# Patient Record
Sex: Female | Born: 1995 | Race: White | Hispanic: No | Smoking: Never smoker
Health system: Southern US, Community
[De-identification: ages and names within clinical notes are randomized; demographics above are authoritative.]

## PROBLEM LIST (undated history)

## (undated) DIAGNOSIS — E282 Polycystic ovarian syndrome: Secondary | ICD-10-CM

## (undated) DIAGNOSIS — E785 Hyperlipidemia, unspecified: Secondary | ICD-10-CM

## (undated) DIAGNOSIS — F32A Depression, unspecified: Secondary | ICD-10-CM

## (undated) DIAGNOSIS — E119 Type 2 diabetes mellitus without complications: Secondary | ICD-10-CM

## (undated) DIAGNOSIS — K589 Irritable bowel syndrome without diarrhea: Secondary | ICD-10-CM

## (undated) DIAGNOSIS — F419 Anxiety disorder, unspecified: Secondary | ICD-10-CM

## (undated) DIAGNOSIS — T7840XA Allergy, unspecified, initial encounter: Secondary | ICD-10-CM

## (undated) DIAGNOSIS — K219 Gastro-esophageal reflux disease without esophagitis: Secondary | ICD-10-CM

## (undated) DIAGNOSIS — R2991 Unspecified symptoms and signs involving the musculoskeletal system: Secondary | ICD-10-CM

## (undated) HISTORY — DX: Gastro-esophageal reflux disease without esophagitis: K21.9

## (undated) HISTORY — PX: TONSILLECTOMY: SUR1361

## (undated) HISTORY — DX: Allergy, unspecified, initial encounter: T78.40XA

## (undated) HISTORY — PX: WRIST SURGERY: SHX841

---

## 2018-04-23 ENCOUNTER — Emergency Department: Payer: 59

## 2018-04-23 ENCOUNTER — Emergency Department
Admission: EM | Admit: 2018-04-23 | Discharge: 2018-04-23 | Disposition: A | Discharge status: Home / Self Care | Payer: 59 | Attending: Emergency Medicine | Admitting: Emergency Medicine

## 2018-04-23 ENCOUNTER — Other Ambulatory Visit: Payer: Self-pay

## 2018-04-23 ENCOUNTER — Encounter: Payer: Self-pay | Admitting: *Deleted

## 2018-04-23 DIAGNOSIS — M549 Dorsalgia, unspecified: Secondary | ICD-10-CM | POA: Diagnosis present

## 2018-04-23 DIAGNOSIS — N201 Calculus of ureter: Secondary | ICD-10-CM

## 2018-04-23 DIAGNOSIS — N23 Unspecified renal colic: Secondary | ICD-10-CM | POA: Diagnosis not present

## 2018-04-23 HISTORY — DX: Unspecified symptoms and signs involving the musculoskeletal system: R29.91

## 2018-04-23 HISTORY — DX: Polycystic ovarian syndrome: E28.2

## 2018-04-23 LAB — URINALYSIS, COMPLETE (UACMP) WITH MICROSCOPIC
Bacteria, UA: NONE SEEN
Bilirubin Urine: NEGATIVE
GLUCOSE, UA: NEGATIVE mg/dL
Ketones, ur: NEGATIVE mg/dL
Nitrite: NEGATIVE
Protein, ur: NEGATIVE mg/dL
Specific Gravity, Urine: 1.019 (ref 1.005–1.030)
pH: 5 (ref 5.0–8.0)

## 2018-04-23 LAB — BASIC METABOLIC PANEL
Anion gap: 9 (ref 5–15)
BUN: 14 mg/dL (ref 6–20)
CALCIUM: 9.6 mg/dL (ref 8.9–10.3)
CO2: 25 mmol/L (ref 22–32)
Chloride: 103 mmol/L (ref 98–111)
Creatinine, Ser: 0.61 mg/dL (ref 0.44–1.00)
GFR calc Af Amer: >60 mL/min (ref >60)
GFR calc non Af Amer: >60 mL/min (ref >60)
Glucose, Bld: 116 mg/dL — ABNORMAL HIGH (ref 70–99)
Potassium: 4 mmol/L (ref 3.5–5.1)
Sodium: 137 mmol/L (ref 135–145)

## 2018-04-23 LAB — CBC
HCT: 42.2 % (ref 36.0–46.0)
Hemoglobin: 14.3 g/dL (ref 12.0–15.0)
MCH: 28.7 pg (ref 26.0–34.0)
MCHC: 33.9 g/dL (ref 30.0–36.0)
MCV: 84.6 fL (ref 80.0–100.0)
Platelets: 373 10*3/uL (ref 150–400)
RBC: 4.99 MIL/uL (ref 3.87–5.11)
RDW: 12 % (ref 11.5–15.5)
WBC: 12.8 10*3/uL — ABNORMAL HIGH (ref 4.0–10.5)
nRBC: 0 % (ref 0.0–0.2)

## 2018-04-23 LAB — POCT PREGNANCY, URINE: Preg Test, Ur: NEGATIVE

## 2018-04-23 MED ORDER — OXYCODONE-ACETAMINOPHEN 5-325 MG PO TABS: 1.0000 | ORAL_TABLET | Freq: Four times a day (QID) | ORAL | 0 refills | Status: DC | PRN

## 2018-04-23 MED ORDER — KETOROLAC TROMETHAMINE 30 MG/ML IJ SOLN
15.0000 mg | Freq: Once | INTRAMUSCULAR | Status: AC
  Administered 2018-04-23: 15 mg via INTRAVENOUS
  Filled 2018-04-23: qty 1

## 2018-04-23 MED ORDER — CEPHALEXIN 500 MG PO CAPS
500.0000 mg | ORAL_CAPSULE | Freq: Once | ORAL | Status: AC
  Administered 2018-04-23: 500 mg via ORAL
  Filled 2018-04-23: qty 1

## 2018-04-23 MED ORDER — ONDANSETRON 4 MG PO TBDP
4.0000 mg | ORAL_TABLET | Freq: Once | ORAL | Status: AC
  Administered 2018-04-23: 4 mg via ORAL
  Filled 2018-04-23: qty 1

## 2018-04-23 MED ORDER — CEPHALEXIN 500 MG PO CAPS: 500.0000 mg | ORAL_CAPSULE | Freq: Two times a day (BID) | ORAL | 0 refills | Status: DC

## 2018-04-23 MED ORDER — MORPHINE SULFATE (PF) 4 MG/ML IV SOLN
8.0000 mg | Freq: Once | INTRAVENOUS | Status: AC
  Administered 2018-04-23: 8 mg via INTRAVENOUS
  Filled 2018-04-23: qty 2

## 2018-04-23 MED ORDER — ONDANSETRON 4 MG PO TBDP: ORAL_TABLET | ORAL | 0 refills | Status: DC

## 2018-04-23 MED ORDER — ONDANSETRON HCL 4 MG/2ML IJ SOLN
4.0000 mg | Freq: Once | INTRAMUSCULAR | Status: AC
  Administered 2018-04-23: 4 mg via INTRAVENOUS
  Filled 2018-04-23: qty 2

## 2018-04-23 MED ORDER — DOCUSATE SODIUM 100 MG PO CAPS: ORAL_CAPSULE | ORAL | 0 refills | Status: DC

## 2018-04-23 MED ORDER — OXYCODONE-ACETAMINOPHEN 5-325 MG PO TABS
2.0000 | ORAL_TABLET | Freq: Once | ORAL | Status: AC
  Administered 2018-04-23: 2 via ORAL
  Filled 2018-04-23: qty 2

## 2018-04-23 NOTE — Discharge Instructions (Signed)
You have been seen in the Emergency Department (ED) today for pain that we believe based on your workup, is caused by kidney stones.  As we have discussed, please drink plenty of fluids.  Please make a follow up appointment with the physician(s) listed elsewhere in this documentation. ° °You may take pain medication as needed but ONLY as prescribed.  Please also take your prescribed Flomax daily.  We also recommend that you take over-the-counter ibuprofen regularly according to label instructions over the next 5 days.  Take it with meals to minimize stomach discomfort. ° °Please see your doctor as soon as possible as stones may take 1-3 weeks to pass and you may require additional care or medications. ° °Do not drink alcohol, drive or participate in any other potentially dangerous activities while taking opiate pain medication as it may make you sleepy. Do not take this medication with any other sedating medications, either prescription or over-the-counter. If you were prescribed Percocet or Vicodin, do not take these with acetaminophen (Tylenol) as it is already contained within these medications. °  °Take Percocet as needed for severe pain.  This medication is an opiate (or narcotic) pain medication and can be habit forming.  Use it as little as possible to achieve adequate pain control.  Do not use or use it with extreme caution if you have a history of opiate abuse or dependence.  If you are on a pain contract with your primary care doctor or a pain specialist, be sure to let them know you were prescribed this medication today from the [facility].  This medication is intended for your use only - do not give any to anyone else and keep it in a secure place where nobody else, especially children, have access to it.  It will also cause or worsen constipation, so you may want to consider taking an over-the-counter stool softener while you are taking this medication. ° °Return to the  Emergency Department (ED) or call your doctor if you have any worsening pain, fever, painful urination, are unable to urinate, or develop other symptoms that concern you. ° °

## 2018-04-23 NOTE — ED Notes (Signed)
Pt unable to void at this time. 

## 2018-04-23 NOTE — ED Provider Notes (Signed)
[facility] Emergency Department Provider Note  ____________________________________________   First MD Initiated Contact with Patient 04/23/18 0407     (approximate)  I have reviewed the triage vital signs and the nursing notes.   HISTORY  Chief Complaint Back Pain    HPI Rhonda Cohen is a 22 y.o. female with noncontributory past medical history who presents for evaluation of acute onset and severe sharp stabbing pain in her left flank that radiates around slightly to the front.  Is been accompanied with nausea and vomiting.  She has no personal history of kidney stones but to first-degree relatives that have had multiple episodes of stones.  She has had no dysuria and no gross hematuria.  Nothing in particular makes the symptoms better or worse and she cannot find a position of comfort.  She reports that about a week ago she was on her menstrual period and had a little bit of pain in the same area but it went away and she attributed it to menstrual pain.  She awoke from sleep several hours ago with the severe pain tonight.  She took 2 extra strength Tylenol and it did not help.   Past Medical History:  Diagnosis Date  . Musculoskeletal abnormal finding on examination    congenital bilateral wrist deformities  . PCOS (polycystic ovarian syndrome)     There are no active problems to display for this patient.   History reviewed. No pertinent surgical history.  Prior to Admission medications   Medication Sig Start Date End Date Taking? Authorizing Provider  cephALEXin (KEFLEX) 500 MG capsule Take 1 capsule (500 mg total) by mouth 2 (two) times daily. 04/23/18   Loleta RoseForbach, Michille Mcelrath, MD  docusate sodium (COLACE) 100 MG capsule Take 1 tablet once or twice daily as needed for constipation while taking narcotic pain medicine 04/23/18   Loleta RoseForbach, Ermie Glendenning, MD  ondansetron (ZOFRAN ODT) 4 MG disintegrating tablet Allow 1-2 tablets to dissolve in your mouth every 8 hours as  needed for nausea/vomiting 04/23/18   Loleta RoseForbach, Alvon Nygaard, MD  oxyCODONE-acetaminophen (PERCOCET) 5-325 MG tablet Take 1-2 tablets by mouth every 6 (six) hours as needed for severe pain. 04/23/18   Loleta RoseForbach, Halia Franey, MD    Allergies Sulfa antibiotics  History reviewed. No pertinent family history.  Social History Social History   Tobacco Use  . Smoking status: Never Smoker  . Smokeless tobacco: Never Used  Substance Use Topics  . Alcohol use: Not Currently  . Drug use: Not on file    Review of Systems Constitutional: No fever/chills Eyes: No visual changes. ENT: No sore throat. Cardiovascular: Denies chest pain. Respiratory: Denies shortness of breath. Gastrointestinal: Left flank pain with nausea and vomiting as described above. Genitourinary: No gross hematuria nor dysuria. Musculoskeletal: Negative for neck pain.  Left flank pain as described above. Integumentary: Negative for rash. Neurological: Negative for headaches, focal weakness or numbness.   ____________________________________________   PHYSICAL EXAM:  VITAL SIGNS: ED Triage Vitals  Enc Vitals Group     BP 04/23/18 0231 (!) 141/100     Pulse Rate 04/23/18 0231 80     Resp 04/23/18 0231 18     Temp 04/23/18 0231 98.3 F (36.8 C)     Temp Source 04/23/18 0231 Oral     SpO2 04/23/18 0231 99 %     Weight 04/23/18 0229 89.8 kg (198 lb)     Height 04/23/18 0229 1.524 m (5')     Head Circumference --  Peak Flow --      Pain Score 04/23/18 0228 8     Pain Loc --      Pain Edu? --      Excl. in GC? --     Constitutional: Alert and oriented.  Appears acutely uncomfortable. Eyes: Conjunctivae are normal.  Head: Atraumatic. Nose: No congestion/rhinnorhea. Mouth/Throat: Mucous membranes are moist. Neck: No stridor.  No meningeal signs.   Cardiovascular: Normal rate, regular rhythm. Good peripheral circulation. Grossly normal heart sounds. Respiratory: Normal respiratory effort.  No retractions. Lungs  CTAB. Gastrointestinal: Soft and nontender. No distention.  Musculoskeletal: Left CVA tenderness to percussion.  No lower extremity tenderness nor edema. No gross deformities of extremities. Neurologic:  Normal speech and language. No gross focal neurologic deficits are appreciated.  Skin:  Skin is warm, dry and intact. No rash noted. Psychiatric: Mood and affect are normal. Speech and behavior are normal.  ____________________________________________   LABS (all labs ordered are listed, but only abnormal results are displayed)  Labs Reviewed  BASIC METABOLIC PANEL - Abnormal; Notable for the following components:      Result Value   Glucose, Bld 116 (*)    All other components within normal limits  CBC - Abnormal; Notable for the following components:   WBC 12.8 (*)    All other components within normal limits  URINALYSIS, COMPLETE (UACMP) WITH MICROSCOPIC - Abnormal; Notable for the following components:   Color, Urine YELLOW (*)    APPearance CLOUDY (*)    Hgb urine dipstick MODERATE (*)    Leukocytes, UA TRACE (*)    All other components within normal limits  URINE CULTURE  POC URINE PREG, ED  POCT PREGNANCY, URINE   ____________________________________________  EKG  None - EKG not ordered by ED physician ____________________________________________  RADIOLOGY   ED MD interpretation: 2 mm left sided UVJ stone with hydroureter and perinephric stranding.  There is also a 4 mm calculus in the right kidney.  Official radiology report(s): Ct Renal Stone Study  Result Date: 04/23/2018 CLINICAL DATA:  Low back pain radiating to left flank EXAM: CT ABDOMEN AND PELVIS WITHOUT CONTRAST TECHNIQUE: Multidetector CT imaging of the abdomen and pelvis was performed following the standard protocol without IV contrast. COMPARISON:  None. FINDINGS: LOWER CHEST: There is no basilar pleural or apical pericardial effusion. HEPATOBILIARY: The hepatic contours and density are normal. There  is no intra- or extrahepatic biliary dilatation. The gallbladder is normal. PANCREAS: The pancreatic parenchymal contours are normal and there is no ductal dilatation. There is no peripancreatic fluid collection. SPLEEN: Normal. ADRENALS/URINARY TRACT: --Adrenal glands: Normal. --Right kidney/ureter: Single nonobstructing renal calculus measuring 4 mm. No hydronephrosis, perinephric stranding or solid renal mass. --Left kidney/ureter: There is a stone within the distal ureter, at the ureterovesical junction measuring 2 mm, causing mild hydroureter and minimal perinephric stranding. --Urinary bladder: Normal for degree of distention STOMACH/BOWEL: --Stomach/Duodenum: There is no hiatal hernia or other gastric abnormality. The duodenal course and caliber are normal. --Small bowel: No dilatation or inflammation. --Colon: No focal abnormality. --Appendix: Normal. VASCULAR/LYMPHATIC: Normal course and caliber of the major abdominal vessels. No abdominal or pelvic lymphadenopathy. REPRODUCTIVE: Normal uterus and ovaries. MUSCULOSKELETAL. No bony spinal canal stenosis or focal osseous abnormality. OTHER: None. IMPRESSION: 1. Left ureterovesical junction stone measuring 2 mm, causing mild hydroureter and perinephric stranding. 2. Single nonobstructing right renal calculus measuring 4 mm. Electronically Signed   By: Deatra RobinsonKevin  Herman M.D.   On: 04/23/2018 03:48    ____________________________________________  PROCEDURES  Critical Care performed: No   Procedure(s) performed:   Procedures   ____________________________________________   INITIAL IMPRESSION / ASSESSMENT AND PLAN / ED COURSE  As part of my medical decision making, I reviewed the following data within the electronic MEDICAL RECORD NUMBER Notes from prior ED visits and [place] Controlled Substance Database    Differential diagnosis includes, but is not limited to, renal colic, UTI/pyelonephritis, less likely renal infarction, ovarian torsion, etc.  The  patient's lab work is notable for what appears to be an contaminated urinalysis with no clear evidence of infection but of which a mild infection is possible.  Urine pregnancy test is negative.  She has a mild leukocytosis of 12.8 and an essentially normal basic metabolic panel.  I have ordered a urine culture.  CT scan demonstrates a 2 mm stone that has nearly passed into the bladder.  I had my usual customary kidney stone discussion with the patient.  We will place an IV and provide morphine 8 mg IV and she is already received Zofran 4 mg by mouth.  Once her pain is adequately controlled she will be transitioned to oral medication and will follow-up as an outpatient with urology.  I am treating her with Keflex on the chance that she does have a urinary tract infection and the morbidity/mortality associated with an infected stone.  She understands and agrees with all of this plan.  Clinical Course as of Apr 23 649  Tue Apr 23, 2018  1610 Patient required a dose of Toradol 15 mg IV and 2 Percocet by mouth but her pain was adequately controlled to allow her to go home.  She has information about follow-up and my usual customary return precautions.   [CF]    Clinical Course User Index [CF] Loleta Rose, MD    ____________________________________________  FINAL CLINICAL IMPRESSION(S) / ED DIAGNOSES  Final diagnoses:  Ureteral stone  Renal colic on left side     MEDICATIONS GIVEN DURING THIS VISIT:  Medications  ondansetron (ZOFRAN-ODT) disintegrating tablet 4 mg (4 mg Oral Given 04/23/18 0404)  morphine 4 MG/ML injection 8 mg (8 mg Intravenous Given 04/23/18 0433)  cephALEXin (KEFLEX) capsule 500 mg (500 mg Oral Given 04/23/18 0433)  ondansetron (ZOFRAN) injection 4 mg (4 mg Intravenous Given 04/23/18 0444)  ketorolac (TORADOL) 30 MG/ML injection 15 mg (15 mg Intravenous Given 04/23/18 0527)  oxyCODONE-acetaminophen (PERCOCET/ROXICET) 5-325 MG per tablet 2 tablet (2 tablets Oral Given  04/23/18 0526)     ED Discharge Orders         Ordered    cephALEXin (KEFLEX) 500 MG capsule  2 times daily     04/23/18 0425    oxyCODONE-acetaminophen (PERCOCET) 5-325 MG tablet  Every 6 hours PRN     04/23/18 0425    ondansetron (ZOFRAN ODT) 4 MG disintegrating tablet     04/23/18 0425    docusate sodium (COLACE) 100 MG capsule     04/23/18 0425           Note:  This document was prepared using Dragon voice recognition software and may include unintentional dictation errors.    Loleta Rose, MD 04/23/18 [phone]

## 2018-04-23 NOTE — ED Triage Notes (Signed)
Pt has left lower back pain.  No known injury.  Reports urinary frequency.  Pt alert.

## 2018-04-25 LAB — URINE CULTURE
Culture: 20000 — AB
Special Requests: NORMAL

## 2018-04-26 NOTE — Progress Notes (Signed)
Pharmacist Communication  Patient recently seen in ED on 12/31. At that time, she had a kidney stone and was given Keflex as prophylaxis for possible UTI and while passing stone. Urine culture resulted with 20,000 colonies enterococcus faecalis, suspect colonization. Spoke with patient today. She reports she has not passed the stone and still has some pain associated with that, but does not appear to have symptoms of UTI. Will not change antibiotic at this time. Informed patient that she may consider following up if she has symptoms of UTI after passing the stone.  Pricilla Riffle, PharmD Pharmacy Resident  04/26/2018 10:21 AM

## 2018-09-10 ENCOUNTER — Emergency Department
Admission: EM | Admit: 2018-09-10 | Discharge: 2018-09-10 | Disposition: A | Discharge status: Home / Self Care | Payer: BLUE CROSS/BLUE SHIELD | Attending: Emergency Medicine | Admitting: Emergency Medicine

## 2018-09-10 ENCOUNTER — Encounter: Payer: Self-pay | Admitting: Emergency Medicine

## 2018-09-10 ENCOUNTER — Emergency Department: Payer: BLUE CROSS/BLUE SHIELD

## 2018-09-10 ENCOUNTER — Other Ambulatory Visit: Payer: Self-pay

## 2018-09-10 DIAGNOSIS — R319 Hematuria, unspecified: Secondary | ICD-10-CM

## 2018-09-10 DIAGNOSIS — N23 Unspecified renal colic: Secondary | ICD-10-CM | POA: Diagnosis not present

## 2018-09-10 LAB — COMPREHENSIVE METABOLIC PANEL
ALT: 20 U/L (ref 0–44)
AST: 21 U/L (ref 15–41)
Albumin: 4.2 g/dL (ref 3.5–5.0)
Alkaline Phosphatase: 46 U/L (ref 38–126)
Anion gap: 10 (ref 5–15)
BUN: 10 mg/dL (ref 6–20)
CO2: 24 mmol/L (ref 22–32)
Calcium: 9.6 mg/dL (ref 8.9–10.3)
Chloride: 107 mmol/L (ref 98–111)
Creatinine, Ser: 0.85 mg/dL (ref 0.44–1.00)
GFR calc Af Amer: >60 mL/min (ref >60)
GFR calc non Af Amer: >60 mL/min (ref >60)
Glucose, Bld: 143 mg/dL — ABNORMAL HIGH (ref 70–99)
Potassium: 4 mmol/L (ref 3.5–5.1)
Sodium: 141 mmol/L (ref 135–145)
Total Bilirubin: 0.5 mg/dL (ref 0.3–1.2)
Total Protein: 8 g/dL (ref 6.5–8.1)

## 2018-09-10 LAB — URINALYSIS, COMPLETE (UACMP) WITH MICROSCOPIC
Bacteria, UA: NONE SEEN
Bilirubin Urine: NEGATIVE
Glucose, UA: NEGATIVE mg/dL
Ketones, ur: NEGATIVE mg/dL
Leukocytes,Ua: NEGATIVE
Nitrite: NEGATIVE
Protein, ur: 30 mg/dL — AB
RBC / HPF: >50 RBC/hpf — ABNORMAL HIGH (ref 0–5)
Specific Gravity, Urine: 1.024 (ref 1.005–1.030)
WBC, UA: NONE SEEN WBC/hpf (ref 0–5)
pH: 5 (ref 5.0–8.0)

## 2018-09-10 LAB — CBC WITH DIFFERENTIAL/PLATELET
Abs Immature Granulocytes: 0.11 10*3/uL — ABNORMAL HIGH (ref 0.00–0.07)
Basophils Absolute: 0 10*3/uL (ref 0.0–0.1)
Basophils Relative: 0 %
Eosinophils Absolute: 0.1 10*3/uL (ref 0.0–0.5)
Eosinophils Relative: 0 %
HCT: 45 % (ref 36.0–46.0)
Hemoglobin: 15.3 g/dL — ABNORMAL HIGH (ref 12.0–15.0)
Immature Granulocytes: 1 %
Lymphocytes Relative: 9 %
Lymphs Abs: 1.4 10*3/uL (ref 0.7–4.0)
MCH: 28.1 pg (ref 26.0–34.0)
MCHC: 34 g/dL (ref 30.0–36.0)
MCV: 82.7 fL (ref 80.0–100.0)
Monocytes Absolute: 0.6 10*3/uL (ref 0.1–1.0)
Monocytes Relative: 4 %
Neutro Abs: 13.4 10*3/uL — ABNORMAL HIGH (ref 1.7–7.7)
Neutrophils Relative %: 86 %
Platelets: 334 10*3/uL (ref 150–400)
RBC: 5.44 MIL/uL — ABNORMAL HIGH (ref 3.87–5.11)
RDW: 12.3 % (ref 11.5–15.5)
WBC: 15.7 10*3/uL — ABNORMAL HIGH (ref 4.0–10.5)
nRBC: 0 % (ref 0.0–0.2)

## 2018-09-10 LAB — POCT PREGNANCY, URINE: Preg Test, Ur: NEGATIVE

## 2018-09-10 MED ORDER — KETOROLAC TROMETHAMINE 30 MG/ML IJ SOLN
15.0000 mg | Freq: Once | INTRAMUSCULAR | Status: AC
  Administered 2018-09-10: 15 mg via INTRAVENOUS
  Filled 2018-09-10: qty 1

## 2018-09-10 MED ORDER — ONDANSETRON HCL 4 MG/2ML IJ SOLN
4.0000 mg | Freq: Once | INTRAMUSCULAR | Status: AC
  Administered 2018-09-10: 4 mg via INTRAVENOUS
  Filled 2018-09-10: qty 2

## 2018-09-10 MED ORDER — SODIUM CHLORIDE 0.9 % IV BOLUS
1000.0000 mL | Freq: Once | INTRAVENOUS | Status: AC
  Administered 2018-09-10: 1000 mL via INTRAVENOUS

## 2018-09-10 MED ORDER — ONDANSETRON 4 MG PO TBDP: 4.0000 mg | ORAL_TABLET | Freq: Three times a day (TID) | ORAL | 0 refills | Status: DC | PRN

## 2018-09-10 MED ORDER — OXYCODONE-ACETAMINOPHEN 7.5-325 MG PO TABS: 1.0000 | ORAL_TABLET | Freq: Two times a day (BID) | ORAL | 0 refills | Status: DC | PRN

## 2018-09-10 MED ORDER — TAMSULOSIN HCL 0.4 MG PO CAPS: 0.4000 mg | ORAL_CAPSULE | Freq: Every day | ORAL | 0 refills | Status: DC

## 2018-09-10 NOTE — ED Triage Notes (Signed)
Patient ambulatory to triage with steady gait, without difficulty or distress noted; pt reports right flank pain radiating into abd accomp by hematuria and  Nausea since last night; st hx kidney stones

## 2018-09-10 NOTE — ED Provider Notes (Signed)
Patient reports improvement in her pain.  Clinically with renal colic, ultrasound with hydronephrosis.  She has leukocytosis which is likely secondary to pain and stress response.  No signs of active infection.  She is cleared for outpatient follow-up.   Emily Filbert, MD 09/10/18 [phone]

## 2018-09-10 NOTE — ED Provider Notes (Signed)
[facility] Emergency Department Provider Note  ____________________________________________  Time seen: Approximately 6:28 AM  I have reviewed the triage vital signs and the nursing notes.   HISTORY  Chief Complaint Hematuria   HPI Micaella Soberanis is a 23 y.o. female with a history of PCOS and kidney stones who presents for evaluation of right flank pain.  Symptoms started at 8:30 PM.  The pain is sharp, radiating across to her abdomen, constant, 8 out of 10.  Pain associated with nausea and 4 episodes of nonbloody nonbilious emesis.  Pain is identical to prior episode of kidney stone.  No fever or chills.  Patient has had no dysuria but noted hematuria.  No diarrhea or constipation.  No chest pain or shortness of breath.  Past Medical History:  Diagnosis Date  . Musculoskeletal abnormal finding on examination    congenital bilateral wrist deformities  . PCOS (polycystic ovarian syndrome)     There are no active problems to display for this patient.   History reviewed. No pertinent surgical history.  Prior to Admission medications   Medication Sig Start Date End Date Taking? Authorizing Provider  cephALEXin (KEFLEX) 500 MG capsule Take 1 capsule (500 mg total) by mouth 2 (two) times daily. 04/23/18   Loleta Rose, MD  docusate sodium (COLACE) 100 MG capsule Take 1 tablet once or twice daily as needed for constipation while taking narcotic pain medicine 04/23/18   Loleta Rose, MD  ondansetron (ZOFRAN ODT) 4 MG disintegrating tablet Allow 1-2 tablets to dissolve in your mouth every 8 hours as needed for nausea/vomiting 04/23/18   Loleta Rose, MD  oxyCODONE-acetaminophen (PERCOCET) 5-325 MG tablet Take 1-2 tablets by mouth every 6 (six) hours as needed for severe pain. 04/23/18   Loleta Rose, MD    Allergies Sulfa antibiotics  No family history on file.  Social History Social History   Tobacco Use  . Smoking status: Never Smoker  .  Smokeless tobacco: Never Used  Substance Use Topics  . Alcohol use: Not Currently  . Drug use: Not on file    Review of Systems  Constitutional: Negative for fever. Eyes: Negative for visual changes. ENT: Negative for sore throat. Neck: No neck pain  Cardiovascular: Negative for chest pain. Respiratory: Negative for shortness of breath. Gastrointestinal: Negative for abdominal pain, vomiting or diarrhea. Genitourinary: Negative for dysuria. + hematuria and flank pain Musculoskeletal: Negative for back pain. Skin: Negative for rash. Neurological: Negative for headaches, weakness or numbness. Psych: No SI or HI  ____________________________________________   PHYSICAL EXAM:  VITAL SIGNS: ED Triage Vitals  Enc Vitals Group     BP 09/10/18 0618 (!) 141/96     Pulse Rate 09/10/18 0618 86     Resp 09/10/18 0618 16     Temp 09/10/18 0618 98 F (36.7 C)     Temp Source 09/10/18 0618 Oral     SpO2 09/10/18 0618 99 %     Weight 09/10/18 0616 195 lb (88.5 kg)     Height 09/10/18 0616 5' (1.524 m)     Head Circumference --      Peak Flow --      Pain Score 09/10/18 0616 8     Pain Loc --      Pain Edu? --      Excl. in GC? --     Constitutional: Alert and oriented. Well appearing and in no apparent distress. HEENT:      Head: Normocephalic and atraumatic.  Eyes: Conjunctivae are normal. Sclera is non-icteric.       Mouth/Throat: Mucous membranes are moist.       Neck: Supple with no signs of meningismus. Cardiovascular: Regular rate and rhythm. No murmurs, gallops, or rubs. 2+ symmetrical distal pulses are present in all extremities. No JVD. Respiratory: Normal respiratory effort. Lungs are clear to auscultation bilaterally. No wheezes, crackles, or rhonchi.  Gastrointestinal: Soft, non tender, and non distended with positive bowel sounds. No rebound or guarding. Genitourinary: No CVA tenderness. Musculoskeletal: Nontender with normal range of motion in all  extremities. No edema, cyanosis, or erythema of extremities. Neurologic: Normal speech and language. Face is symmetric. Moving all extremities. No gross focal neurologic deficits are appreciated. Skin: Skin is warm, dry and intact. No rash noted. Psychiatric: Mood and affect are normal. Speech and behavior are normal.  ____________________________________________   LABS (all labs ordered are listed, but only abnormal results are displayed)  Labs Reviewed  CBC WITH DIFFERENTIAL/PLATELET  COMPREHENSIVE METABOLIC PANEL  URINALYSIS, COMPLETE (UACMP) WITH MICROSCOPIC  POC URINE PREG, ED   ____________________________________________  EKG  none  ____________________________________________  RADIOLOGY   XR and US pending ____________________________________________   PROCEDURES  Procedure(s) performed: None Procedures Critical Care performed:  None ____________________________________________   INITIAL IMPRESSION / ASSESSMENT AND PLAN / ED COURSE  23 y.o. female with a history of PCOS and kidney stones who presents for evaluation of right flank pain and hematuria. Ddx kidney stone, pyelonephritis, GB pathology, appendicitis. Patient with no abd tenderness, no flank tenderness. Labs, UA, KUB, and US pending. Will treat symptoms with zofan and toradol.   Clinical Course as of Sep 10 638  Tue Sep 10, 2018  0639 Labs, UA, imaging pending. Care transferred to Dr. Mayford KnifeWilliams.   [CV]    Clinical Course User Index [CV] Don PerkingVeronese, [place], MD     As part of my medical decision making, I reviewed the following data within the electronic MEDICAL RECORD NUMBER Nursing notes reviewed and incorporated, Labs reviewed , Old chart reviewed, Notes from prior ED visits and [place] Controlled Substance Database    Pertinent labs & imaging results that were available during my care of the patient were reviewed by me and considered in my medical decision making (see chart for details).     ____________________________________________   FINAL CLINICAL IMPRESSION(S) / ED DIAGNOSES  Final diagnoses:  Hematuria, unspecified type      NEW MEDICATIONS STARTED DURING THIS VISIT:  ED Discharge Orders    None       Note:  This document was prepared using Dragon voice recognition software and may include unintentional dictation errors.    Don PerkingVeronese, [place], MD 09/10/18 [phone]

## 2018-09-10 NOTE — ED Notes (Signed)
Patient transported to Ultrasound 

## 2018-09-10 NOTE — ED Notes (Signed)
Given water, ok per dr williams 

## 2018-09-17 ENCOUNTER — Telehealth: Payer: Self-pay | Admitting: Urology

## 2018-09-17 NOTE — Telephone Encounter (Signed)
Called pt to scheduled and she declined said she would call back later   [facility]

## 2018-09-17 NOTE — Telephone Encounter (Signed)
-----   Message from Riki Altes, MD sent at 09/17/2018  1:16 PM EDT ----- Regarding: Appointment Please schedule follow-up appointment 1-2 weeks.  Any provider

## 2018-11-07 ENCOUNTER — Emergency Department
Admission: EM | Admit: 2018-11-07 | Discharge: 2018-11-07 | Disposition: A | Discharge status: Home / Self Care | Payer: BC Managed Care – PPO | Attending: Emergency Medicine | Admitting: Emergency Medicine

## 2018-11-07 ENCOUNTER — Other Ambulatory Visit: Payer: Self-pay

## 2018-11-07 ENCOUNTER — Emergency Department: Payer: BC Managed Care – PPO

## 2018-11-07 DIAGNOSIS — N939 Abnormal uterine and vaginal bleeding, unspecified: Secondary | ICD-10-CM | POA: Insufficient documentation

## 2018-11-07 DIAGNOSIS — N200 Calculus of kidney: Secondary | ICD-10-CM | POA: Diagnosis not present

## 2018-11-07 DIAGNOSIS — R109 Unspecified abdominal pain: Secondary | ICD-10-CM | POA: Diagnosis present

## 2018-11-07 LAB — BASIC METABOLIC PANEL
Anion gap: 11 (ref 5–15)
BUN: 12 mg/dL (ref 6–20)
CO2: 20 mmol/L — ABNORMAL LOW (ref 22–32)
Calcium: 8.9 mg/dL (ref 8.9–10.3)
Chloride: 108 mmol/L (ref 98–111)
Creatinine, Ser: 0.68 mg/dL (ref 0.44–1.00)
GFR calc Af Amer: >60 mL/min (ref >60)
GFR calc non Af Amer: >60 mL/min (ref >60)
Glucose, Bld: 102 mg/dL — ABNORMAL HIGH (ref 70–99)
Potassium: 3.8 mmol/L (ref 3.5–5.1)
Sodium: 139 mmol/L (ref 135–145)

## 2018-11-07 LAB — URINALYSIS, COMPLETE (UACMP) WITH MICROSCOPIC
Bacteria, UA: NONE SEEN
Bilirubin Urine: NEGATIVE
Glucose, UA: NEGATIVE mg/dL
Ketones, ur: NEGATIVE mg/dL
Leukocytes,Ua: NEGATIVE
Nitrite: NEGATIVE
Protein, ur: 30 mg/dL — AB
RBC / HPF: >50 RBC/hpf — ABNORMAL HIGH (ref 0–5)
Specific Gravity, Urine: 1.023 (ref 1.005–1.030)
pH: 5 (ref 5.0–8.0)

## 2018-11-07 LAB — CBC
HCT: 40.4 % (ref 36.0–46.0)
Hemoglobin: 13.6 g/dL (ref 12.0–15.0)
MCH: 27.6 pg (ref 26.0–34.0)
MCHC: 33.7 g/dL (ref 30.0–36.0)
MCV: 81.9 fL (ref 80.0–100.0)
Platelets: 399 10*3/uL (ref 150–400)
RBC: 4.93 MIL/uL (ref 3.87–5.11)
RDW: 12.7 % (ref 11.5–15.5)
WBC: 13.5 10*3/uL — ABNORMAL HIGH (ref 4.0–10.5)
nRBC: 0 % (ref 0.0–0.2)

## 2018-11-07 LAB — POCT PREGNANCY, URINE: Preg Test, Ur: NEGATIVE

## 2018-11-07 MED ORDER — OXYCODONE-ACETAMINOPHEN 5-325 MG PO TABS: 1.0000 | ORAL_TABLET | ORAL | 0 refills | Status: DC | PRN

## 2018-11-07 MED ORDER — ONDANSETRON 4 MG PO TBDP: 4.0000 mg | ORAL_TABLET | Freq: Three times a day (TID) | ORAL | 0 refills | Status: DC | PRN

## 2018-11-07 MED ORDER — ONDANSETRON HCL 4 MG/2ML IJ SOLN
4.0000 mg | Freq: Once | INTRAMUSCULAR | Status: AC
  Administered 2018-11-07: 17:00:00 4 mg via INTRAVENOUS
  Filled 2018-11-07: qty 2

## 2018-11-07 MED ORDER — KETOROLAC TROMETHAMINE 30 MG/ML IJ SOLN
30.0000 mg | Freq: Once | INTRAMUSCULAR | Status: AC
  Administered 2018-11-07: 20:00:00 30 mg via INTRAVENOUS
  Filled 2018-11-07: qty 1

## 2018-11-07 MED ORDER — TAMSULOSIN HCL 0.4 MG PO CAPS: 0.4000 mg | ORAL_CAPSULE | Freq: Every day | ORAL | 0 refills | Status: DC

## 2018-11-07 MED ORDER — FENTANYL CITRATE (PF) 100 MCG/2ML IJ SOLN
50.0000 ug | INTRAMUSCULAR | Status: AC | PRN
  Administered 2018-11-07 (×2): 50 ug via INTRAVENOUS
  Filled 2018-11-07: qty 2

## 2018-11-07 NOTE — ED Notes (Signed)
Patient reports taking a 5/325mg  percocet approx. 1 hour ago and states, "I puked it up 2 minutes later, it hasn't touched the pain."

## 2018-11-07 NOTE — ED Triage Notes (Signed)
Patient presents to the ED with right flank pain that radiates into right lower quadrant.  Patient vomiting in waiting room and appears uncomfortable in triage.  Patient reports history of kidney stones and states this feels similar to how she felt in the past.

## 2018-11-07 NOTE — ED Provider Notes (Signed)
[facility] Emergency Department Provider Note  Time seen: 8:03 PM  I have reviewed the triage vital signs and the nursing notes.   HISTORY  Chief Complaint Flank Pain   HPI Earl LagosKaitlyn Cohen is a 23 y.o. female with a past medical history of PCOS, presents to the emergency department for right flank pain.  According to the patient she developed right flank pain earlier today, history of kidney stones which this feels identical.  States moderate sharp pain in the right flank.  Has not noted blood in her urine but states she is currently on her menstrual cycle.  Denies any fever cough or congestion.   Past Medical History:  Diagnosis Date  . Musculoskeletal abnormal finding on examination    congenital bilateral wrist deformities  . PCOS (polycystic ovarian syndrome)     There are no active problems to display for this patient.   History reviewed. No pertinent surgical history.  Prior to Admission medications   Medication Sig Start Date End Date Taking? Authorizing Provider  cephALEXin (KEFLEX) 500 MG capsule Take 1 capsule (500 mg total) by mouth 2 (two) times daily. 04/23/18   Loleta RoseForbach, Cory, MD  docusate sodium (COLACE) 100 MG capsule Take 1 tablet once or twice daily as needed for constipation while taking narcotic pain medicine 04/23/18   Loleta RoseForbach, Cory, MD  ondansetron (ZOFRAN ODT) 4 MG disintegrating tablet Allow 1-2 tablets to dissolve in your mouth every 8 hours as needed for nausea/vomiting 04/23/18   Loleta RoseForbach, Cory, MD  ondansetron (ZOFRAN ODT) 4 MG disintegrating tablet Take 1 tablet (4 mg total) by mouth every 8 (eight) hours as needed for nausea or vomiting. 09/10/18   Emily FilbertWilliams, Jonathan E, MD  oxyCODONE-acetaminophen (PERCOCET) 5-325 MG tablet Take 1-2 tablets by mouth every 6 (six) hours as needed for severe pain. 04/23/18   Loleta RoseForbach, Cory, MD  oxyCODONE-acetaminophen (PERCOCET) 7.5-325 MG tablet Take 1 tablet by mouth 2 (two) times daily as needed for  severe pain. 09/10/18 09/10/19  Emily FilbertWilliams, Jonathan E, MD  tamsulosin (FLOMAX) 0.4 MG CAPS capsule Take 1 capsule (0.4 mg total) by mouth daily after breakfast. 09/10/18   Emily FilbertWilliams, Jonathan E, MD    Allergies  Allergen Reactions  . Sulfa Antibiotics Other (See Comments)    No family history on file.  Social History Social History   Tobacco Use  . Smoking status: Never Smoker  . Smokeless tobacco: Never Used  Substance Use Topics  . Alcohol use: Not Currently  . Drug use: Not on file    Review of Systems Constitutional: Negative for fever ENT: Negative for recent illness/congestion Cardiovascular: Negative for chest pain. Respiratory: Negative for shortness of breath. Gastrointestinal: Right flank pain. Genitourinary: Negative for hematuria, currently on her menstrual cycle. Musculoskeletal: Negative for musculoskeletal complaints Skin: Negative for skin complaints  Neurological: Negative for headache All other ROS negative  ____________________________________________   PHYSICAL EXAM:  VITAL SIGNS: ED Triage Vitals  Enc Vitals Group     BP 11/07/18 1713 (!) 150/110     Pulse Rate 11/07/18 1713 78     Resp 11/07/18 1713 20     Temp 11/07/18 1713 98.4 F (36.9 C)     Temp Source 11/07/18 1713 Oral     SpO2 11/07/18 1713 95 %     Weight 11/07/18 1714 195 lb (88.5 kg)     Height 11/07/18 1714 5' (1.524 m)     Head Circumference --      Peak Flow --  Pain Score 11/07/18 1713 10     Pain Loc --      Pain Edu? --      Excl. in [place]? --    Constitutional: Alert and oriented. Well appearing and in no distress. Eyes: Normal exam ENT      Head: Normocephalic and atraumatic.      Mouth/Throat: Mucous membranes are moist. Cardiovascular: Normal rate, regular rhythm.  Respiratory: Normal respiratory effort without tachypnea nor retractions. Breath sounds are clear  Gastrointestinal: Soft and nontender. No distention.  There is no CVA tenderness. Musculoskeletal:  Nontender with normal range of motion in all extremities.  Neurologic:  Normal speech and language. No gross focal neurologic deficits Skin:  Skin is warm, dry and intact.  Psychiatric: Mood and affect are normal.   ____________________________________________   RADIOLOGY  Mild right hydronephrosis  ____________________________________________   INITIAL IMPRESSION / ASSESSMENT AND PLAN / ED COURSE  Pertinent labs & imaging results that were available during my care of the patient were reviewed by me and considered in my medical decision making (see chart for details).   Patient presents to the emergency department for right flank pain since earlier today.  History of kidney stones which this feels identical per patient.  We will check labs.  Patient states she has never required lithotripsy or lasering procedure for a stone she has passed them all spontaneously.  Patient's lab work shows blood in her urine, and this could represent ureterolithiasis versus her menstrual cycle.  We will obtain a renal ultrasound to further evaluate.  Patient agreeable to plan of care.  Reassuringly patient's kidney function is normal.  Ultrasound shows moderate hydronephrosis likely representing kidney stone.  Patient has passed all of her kidney stone spontaneously in the past.  We will place on Flomax, pain medication.  I discussed return precautions.  Patient agreeable to plan of care.  Nirvi Boehler was evaluated in Emergency Department on 11/07/2018 for the symptoms described in the history of present illness. She was evaluated in the context of the global COVID-19 pandemic, which necessitated consideration that the patient might be at risk for infection with the SARS-CoV-2 virus that causes COVID-19. Institutional protocols and algorithms that pertain to the evaluation of patients at risk for COVID-19 are in a state of rapid change based on information released by regulatory bodies including the CDC and  federal and state organizations. These policies and algorithms were followed during the patient's care in the ED.  ____________________________________________   FINAL CLINICAL IMPRESSION(S) / ED DIAGNOSES  Right flank pain Kidney stone   Harvest Dark, MD 11/07/18 2235

## 2019-10-13 ENCOUNTER — Ambulatory Visit: Payer: BC Managed Care – PPO | Attending: Internal Medicine

## 2019-10-13 DIAGNOSIS — Z23 Encounter for immunization: Secondary | ICD-10-CM

## 2019-10-13 NOTE — Progress Notes (Signed)
   Covid-19 Vaccination Clinic  Name:  Natalynn Pedone    MRN: [id number] DOB: Apr 30, 1995  10/13/2019  Ms. Wynia was observed post Covid-19 immunization for 15 minutes without incident. She was provided with Vaccine Information Sheet and instruction to access the V-Safe system.   Ms. Roarty was instructed to call 911 with any severe reactions post vaccine: Marland Kitchen Difficulty breathing  . Swelling of face and throat  . A fast heartbeat  . A bad rash all over body  . Dizziness and weakness   Immunizations Administered    Name Date Dose VIS Date Route   Pfizer COVID-19 Vaccine 10/13/2019  2:56 PM 0.3 mL 06/18/2018 Intramuscular   Manufacturer: [employer], [employer]   Lot: [id number]   NDC: [address]-0

## 2019-10-27 ENCOUNTER — Other Ambulatory Visit: Payer: Self-pay

## 2019-10-27 ENCOUNTER — Emergency Department (HOSPITAL_COMMUNITY)
Admission: EM | Admit: 2019-10-27 | Discharge: 2019-10-27 | Disposition: A | Discharge status: Home / Self Care | Payer: No Typology Code available for payment source | Attending: Emergency Medicine | Admitting: Emergency Medicine

## 2019-10-27 ENCOUNTER — Emergency Department (HOSPITAL_COMMUNITY): Payer: No Typology Code available for payment source

## 2019-10-27 ENCOUNTER — Encounter (HOSPITAL_COMMUNITY): Payer: Self-pay | Admitting: *Deleted

## 2019-10-27 DIAGNOSIS — Y929 Unspecified place or not applicable: Secondary | ICD-10-CM | POA: Diagnosis not present

## 2019-10-27 DIAGNOSIS — Y939 Activity, unspecified: Secondary | ICD-10-CM | POA: Diagnosis not present

## 2019-10-27 DIAGNOSIS — Y990 Civilian activity done for income or pay: Secondary | ICD-10-CM | POA: Insufficient documentation

## 2019-10-27 DIAGNOSIS — Z79899 Other long term (current) drug therapy: Secondary | ICD-10-CM | POA: Insufficient documentation

## 2019-10-27 DIAGNOSIS — S99911A Unspecified injury of right ankle, initial encounter: Secondary | ICD-10-CM | POA: Diagnosis present

## 2019-10-27 DIAGNOSIS — S93401A Sprain of unspecified ligament of right ankle, initial encounter: Secondary | ICD-10-CM | POA: Insufficient documentation

## 2019-10-27 DIAGNOSIS — X500XXA Overexertion from strenuous movement or load, initial encounter: Secondary | ICD-10-CM | POA: Insufficient documentation

## 2019-10-27 MED ORDER — DICLOFENAC SODIUM ER 100 MG PO TB24: 100.0000 mg | ORAL_TABLET | Freq: Every day | ORAL | 0 refills | Status: DC

## 2019-10-27 MED ORDER — NAPROXEN 250 MG PO TABS
500.0000 mg | ORAL_TABLET | Freq: Once | ORAL | Status: AC
  Administered 2019-10-27: 500 mg via ORAL
  Filled 2019-10-27: qty 2

## 2019-10-27 MED ORDER — ACETAMINOPHEN 500 MG PO TABS
1000.0000 mg | ORAL_TABLET | Freq: Once | ORAL | Status: AC
  Administered 2019-10-27: 1000 mg via ORAL
  Filled 2019-10-27: qty 2

## 2019-10-27 NOTE — ED Provider Notes (Signed)
Attempted to see patient multiple times, never in room.   Roxy Horseman, PA-C 10/27/19 0615    Palumbo, April, MD 10/27/19 [phone]

## 2019-10-27 NOTE — Progress Notes (Signed)
Orthopedic Tech Progress Note Patient Details:  Rhonda Cohen [birth year] [id number]  Ortho Devices Type of Ortho Device: ASO, Crutches Ortho Device/Splint Location: rle Ortho Device/Splint Interventions: Ordered, Application, Adjustment   Post Interventions Patient Tolerated: Well Instructions Provided: Care of device, Adjustment of device   [name] Post 10/27/2019, 7:26 AM

## 2019-10-27 NOTE — ED Notes (Signed)
Discharge instructions discussed with pt. Pt verbalized understanding. Pt stable and ambulatory. No signature pad available. 

## 2019-10-27 NOTE — ED Provider Notes (Signed)
[facility] EMERGENCY DEPARTMENT Provider Note   CSN: [id number] Arrival date & time: 10/27/19  8921     History Chief Complaint  Patient presents with  . Ankle Pain    Rhonda Cohen is a 24 y.o. female.  The history is provided by the patient.  Ankle Pain Location:  Ankle Injury: yes   Mechanism of injury comment:  Stepped down on the ankle and felt a pop  Ankle location:  R ankle Pain details:    Quality:  Aching   Radiates to:  Does not radiate   Severity:  Severe   Onset quality:  Sudden   Timing:  Constant   Progression:  Unchanged Chronicity:  New Dislocation: no   Prior injury to area:  No Relieved by:  Nothing Worsened by:  Nothing Ineffective treatments:  None tried Associated symptoms: no back pain, no fever, no neck pain, no stiffness, no swelling and no tingling   Risk factors: no concern for non-accidental trauma   Patient stepped down off the ambulance and felt a pop in her right ankle and cannot bear weight secondary to pain.       Past Medical History:  Diagnosis Date  . Musculoskeletal abnormal finding on examination    congenital bilateral wrist deformities  . PCOS (polycystic ovarian syndrome)     There are no problems to display for this patient.   History reviewed. No pertinent surgical history.   OB History   No obstetric history on file.     No family history on file.  Social History   Tobacco Use  . Smoking status: Never Smoker  . Smokeless tobacco: Current User  Substance Use Topics  . Alcohol use: Not Currently  . Drug use: Not on file    Home Medications Prior to Admission medications   Medication Sig Start Date End Date Taking? Authorizing Provider  cephALEXin (KEFLEX) 500 MG capsule Take 1 capsule (500 mg total) by mouth 2 (two) times daily. 04/23/18   Loleta Rose, MD  docusate sodium (COLACE) 100 MG capsule Take 1 tablet once or twice daily as needed for constipation while taking narcotic pain  medicine 04/23/18   Loleta Rose, MD  ondansetron (ZOFRAN ODT) 4 MG disintegrating tablet Take 1 tablet (4 mg total) by mouth every 8 (eight) hours as needed for nausea or vomiting. 11/07/18   Minna Antis, MD  oxyCODONE-acetaminophen (PERCOCET) 5-325 MG tablet Take 1 tablet by mouth every 4 (four) hours as needed for severe pain. 11/07/18   Minna Antis, MD  tamsulosin (FLOMAX) 0.4 MG CAPS capsule Take 1 capsule (0.4 mg total) by mouth daily. 11/07/18   Minna Antis, MD    Allergies    Sulfa antibiotics  Review of Systems   Review of Systems  Constitutional: Negative for fever.  HENT: Negative for congestion.   Eyes: Negative for visual disturbance.  Respiratory: Negative for apnea.   Cardiovascular: Negative for chest pain.  Gastrointestinal: Negative for abdominal pain.  Genitourinary: Negative for difficulty urinating.  Musculoskeletal: Positive for arthralgias. Negative for back pain, neck pain and stiffness.  Skin: Negative for color change.  Neurological: Negative for dizziness.  Psychiatric/Behavioral: Negative for agitation.  All other systems reviewed and are negative.   Physical Exam Updated Vital Signs BP 129/90 (BP Location: Right Arm)   Pulse 87   Temp 98.7 F (37.1 C) (Oral)   Resp 18   Ht 5\' 6"  (1.676 m)   Wt 89.8 kg   LMP 10/27/2019  SpO2 100%   BMI 31.96 kg/m   Physical Exam Vitals and nursing note reviewed.  Constitutional:      General: She is not in acute distress.    Appearance: Normal appearance.  HENT:     Head: Normocephalic and atraumatic.     Nose: Nose normal.  Eyes:     Conjunctiva/sclera: Conjunctivae normal.     Pupils: Pupils are equal, round, and reactive to light.  Cardiovascular:     Rate and Rhythm: Normal rate and regular rhythm.     Pulses: Normal pulses.     Heart sounds: Normal heart sounds.  Pulmonary:     Effort: Pulmonary effort is normal.     Breath sounds: Normal breath sounds.  Abdominal:      General: Abdomen is flat. Bowel sounds are normal.     Tenderness: There is no abdominal tenderness. There is no guarding or rebound.  Musculoskeletal:     Cervical back: Normal range of motion and neck supple.     Right lower leg: Normal. No deformity or tenderness.     Right ankle: No swelling, deformity, ecchymosis or lacerations. Normal range of motion. Anterior drawer test negative. Normal pulse.     Right Achilles Tendon: Normal. No tenderness or defects. Thompson's test negative.     Left ankle: Normal.     Left Achilles Tendon: Normal.     Right foot: Normal.     Comments: Achilles is contiguous   Skin:    General: Skin is warm and dry.     Capillary Refill: Capillary refill takes less than 2 seconds.  Neurological:     General: No focal deficit present.     Mental Status: She is alert and oriented to person, place, and time.     Deep Tendon Reflexes: Reflexes normal.  Psychiatric:        Mood and Affect: Mood normal.        Behavior: Behavior normal.     ED Results / Procedures / Treatments   Labs (all labs ordered are listed, but only abnormal results are displayed) Labs Reviewed - No data to display  EKG None  Radiology DG Ankle Complete Right  Result Date: 10/27/2019 CLINICAL DATA:  24 year old female with history of right ankle pain. EXAM: RIGHT ANKLE - COMPLETE 3+ VIEW COMPARISON:  No priors. FINDINGS: There is no evidence of fracture, dislocation, or joint effusion. There is no evidence of arthropathy or other focal bone abnormality. Soft tissues are unremarkable. IMPRESSION: Negative. Electronically Signed   By: Trudie Reed M.D.   On: 10/27/2019 04:35    Procedures Procedures (including critical care time)  Medications Ordered in ED Medications  naproxen (NAPROSYN) tablet 500 mg (has no administration in time range)  acetaminophen (TYLENOL) tablet 1,000 mg (has no administration in time range)    ED Course  I have reviewed the triage vital signs and  the nursing notes.  Pertinent labs & imaging results that were available during my care of the patient were reviewed by me and considered in my medical decision making (see chart for details).   ASO applied, given crutches.  Patient is concerned about achilles tendon but it is contiguous with negative thompson test.  I will refer to orthopedics as the patient continues to state there is something wrong.  Gastrocnemius and soleus also intact on exam.  Patient is moving ankle in all directions.  ICE, elevation and NSAIDS.  Chrishonda Hesch was evaluated in Emergency Department on 10/27/2019 for the  symptoms described in the history of present illness. She was evaluated in the context of the global COVID-19 pandemic, which necessitated consideration that the patient might be at risk for infection with the SARS-CoV-2 virus that causes COVID-19. Institutional protocols and algorithms that pertain to the evaluation of patients at risk for COVID-19 are in a state of rapid change based on information released by regulatory bodies including the CDC and federal and state organizations. These policies and algorithms were followed during the patient's care in the ED.  Final Clinical Impression(s) / ED Diagnoses Return for intractable cough, coughing up blood,fevers >100.4 unrelieved by medication, shortness of breath, intractable vomiting, chest pain, shortness of breath, weakness,numbness, changes in speech, facial asymmetry,abdominal pain, passing out,Inability to tolerate liquids or food, cough, altered mental status or any concerns. No signs of systemic illness or infection. The patient is nontoxic-appearing on exam and vital signs are within normal limits.   I have reviewed the triage vital signs and the nursing notes. Pertinent labs &imaging results that were available during my care of the patient were reviewed by me and considered in my medical decision making (see chart for details).After history,  exam, and medical workup I feel the patient has beenappropriately medically screened and is safe for discharge home. Pertinent diagnoses were discussed with the patient. Patient was given return precautions.      Anevay Campanella, MD 10/27/19 0488

## 2019-10-27 NOTE — ED Triage Notes (Signed)
The pt is c/o rt ankle pain she stepped  Off an ambulance earlier tonight and felt something pop in her rt ankle  Extreme pain since then limited movement  lmp now

## 2019-11-06 ENCOUNTER — Ambulatory Visit: Payer: 59 | Attending: Internal Medicine

## 2020-03-03 ENCOUNTER — Ambulatory Visit: Payer: PRIVATE HEALTH INSURANCE | Admitting: Dietician

## 2020-07-03 IMAGING — US US RENAL
1 series · 14 of 25 positions shown · non-contrast
Comparison: 04/23/2018

CLINICAL DATA: Gross hematuria and flank pain

EXAM:
RENAL / URINARY TRACT ULTRASOUND COMPLETE

[Series 1: us renal · 0.30mm/px · 14 of 37 slices shown]
[im 1/37]
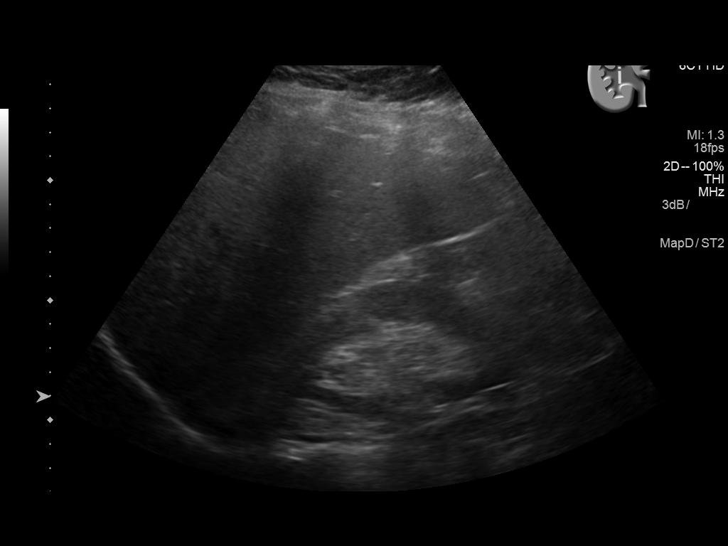
[im 4/37]
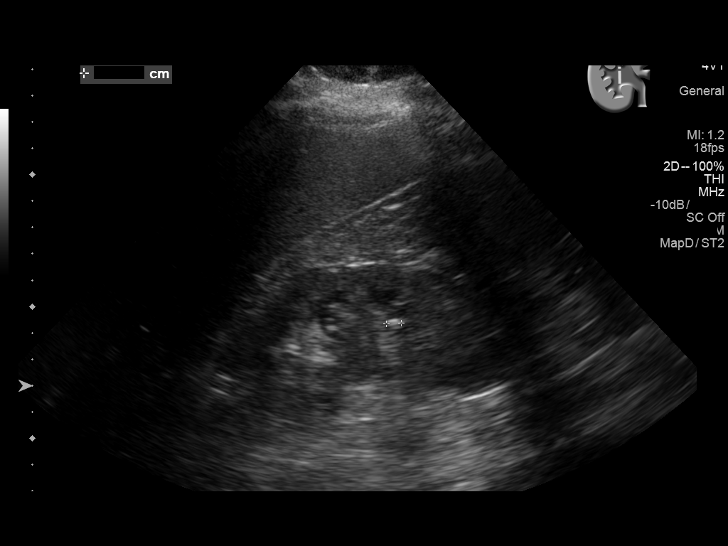
[im 7/37]
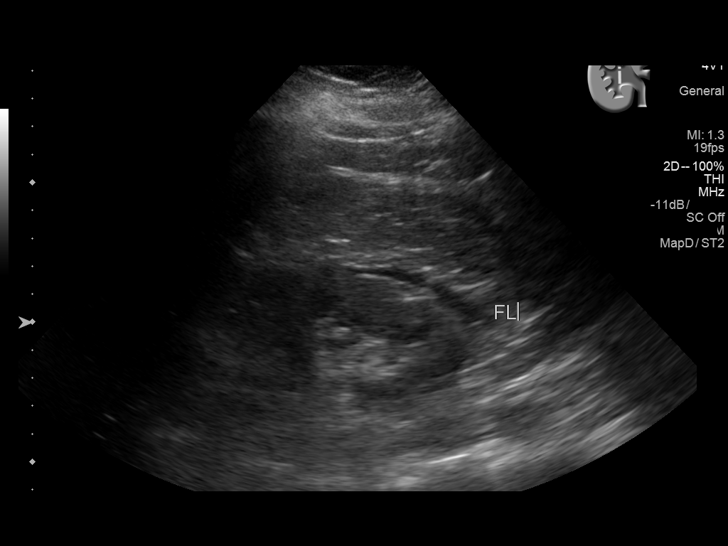
[im 10/37]
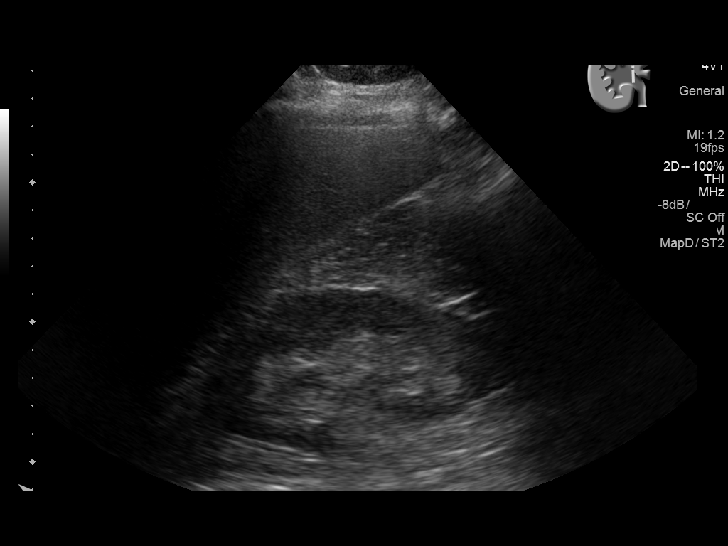
[im 13/37]
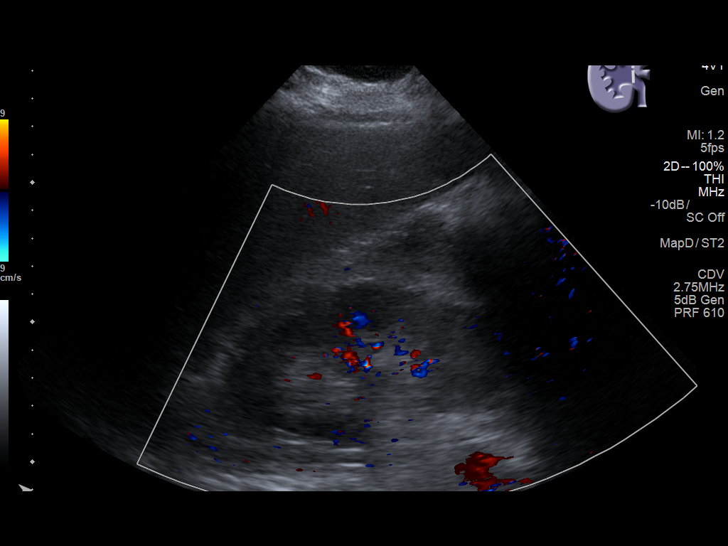
[im 14/37]
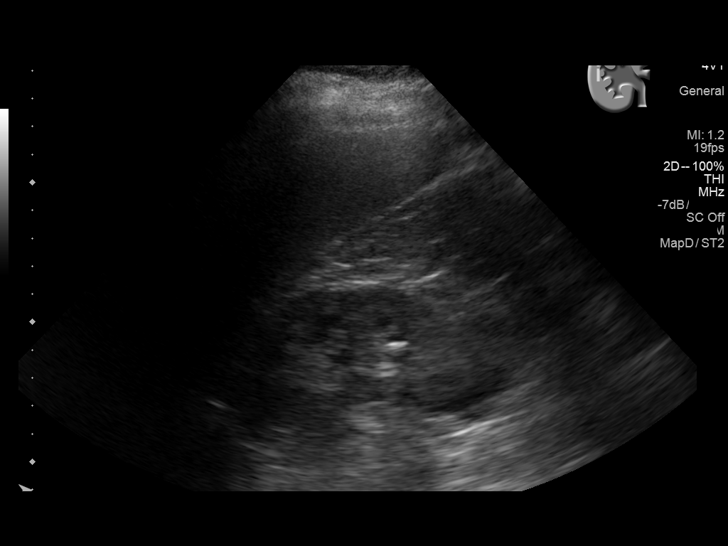
[im 17/37]
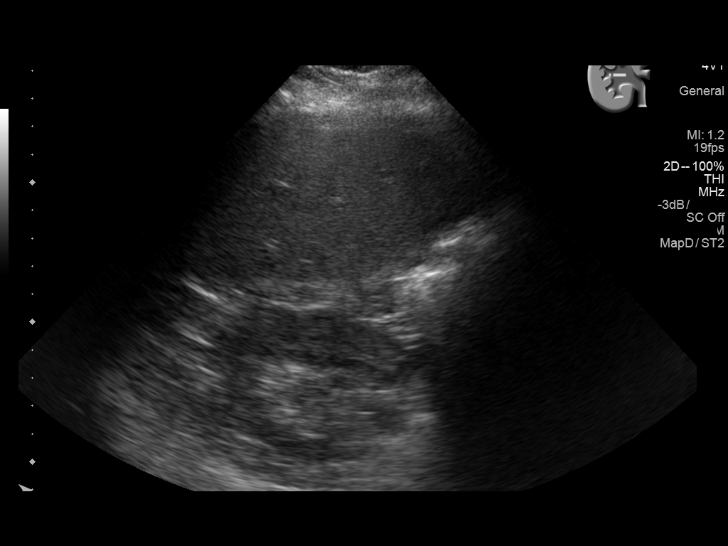
[im 20/37]
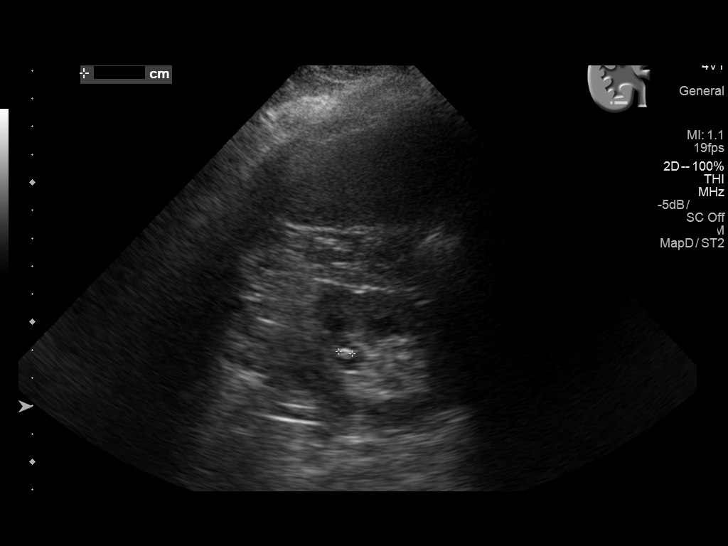
[im 23/37]
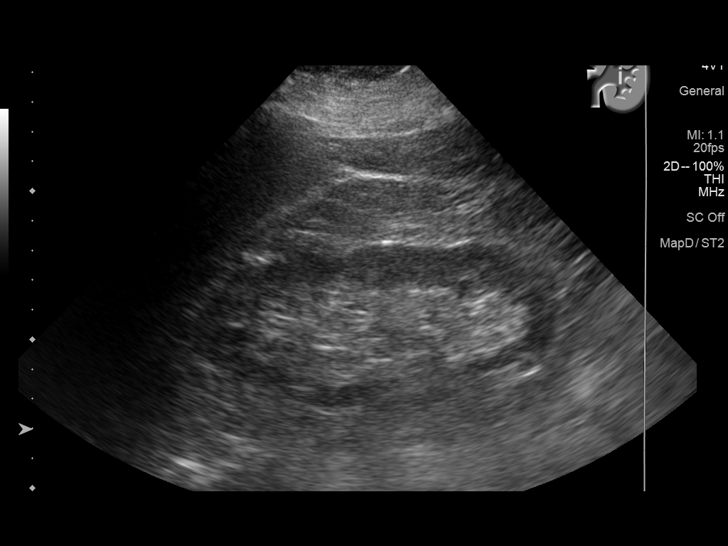
[im 25/37]
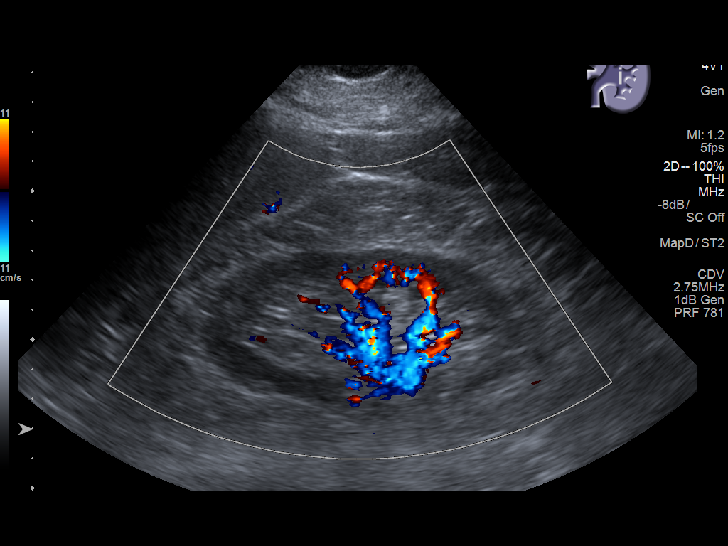
[im 28/37]
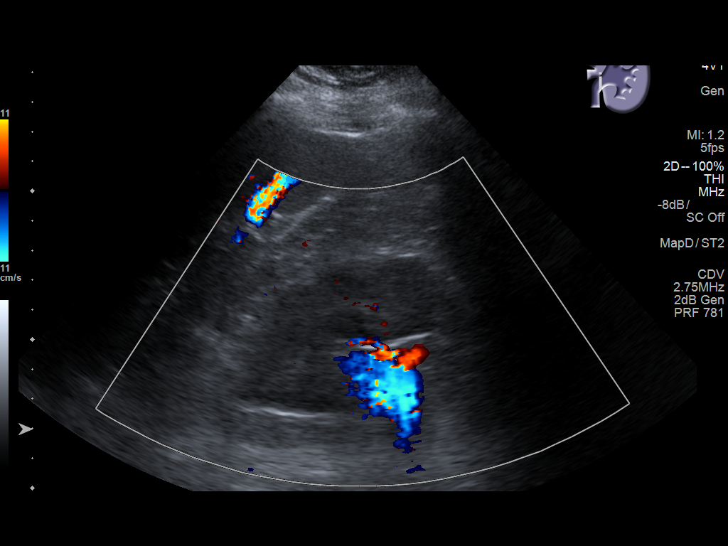
[im 31/37]
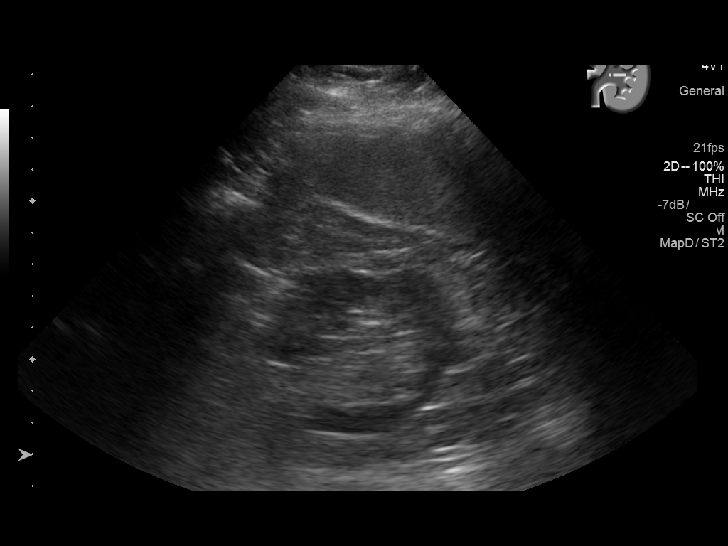
[im 34/37]
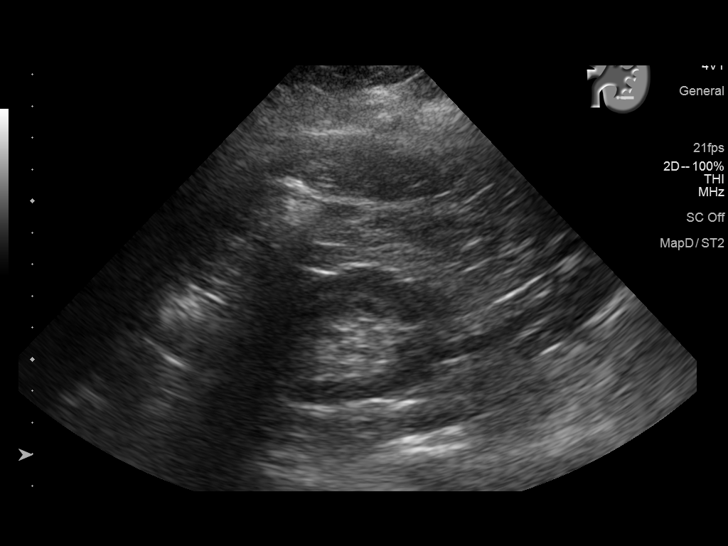
[im 37/37]
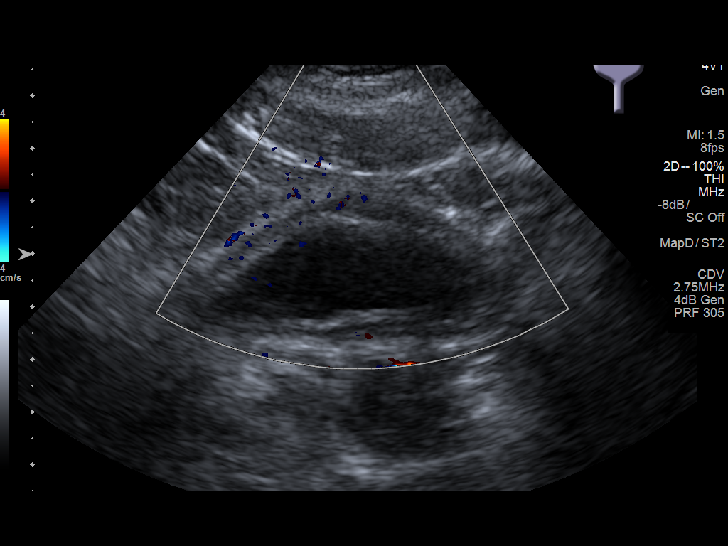

[14 of 25 positions shown; findings below may reference images not displayed]

FINDINGS: Right Kidney:

Renal measurements: 11.2 x 5.9 x 6.7 cm. = volume: 230 mL. Very mild
right hydronephrosis is noted new from the prior exam. Lower pole
renal stone is noted similar to that seen on prior study.

Left Kidney:

Renal measurements: 12.4 x 5.2 x 6.0 cm = volume: 201 mL.
Echogenicity within normal limits. No mass or hydronephrosis
visualized.

Bladder:

Appears normal for degree of bladder distention.
IMPRESSION: Mild right hydronephrosis. A distal ureteral stone may be present.
CT urography may be helpful for further evaluation.

Tiny nonobstructing right renal stone.

## 2020-09-10 IMAGING — US US RENAL
2 series · 14 of 25 positions shown · non-contrast
Comparison: None.

CLINICAL DATA: Right flank pain

EXAM:
RENAL / URINARY TRACT ULTRASOUND COMPLETE

[Series 1: us renal · 13 of 59 slices shown (1 of 2)]
[im 1/59]
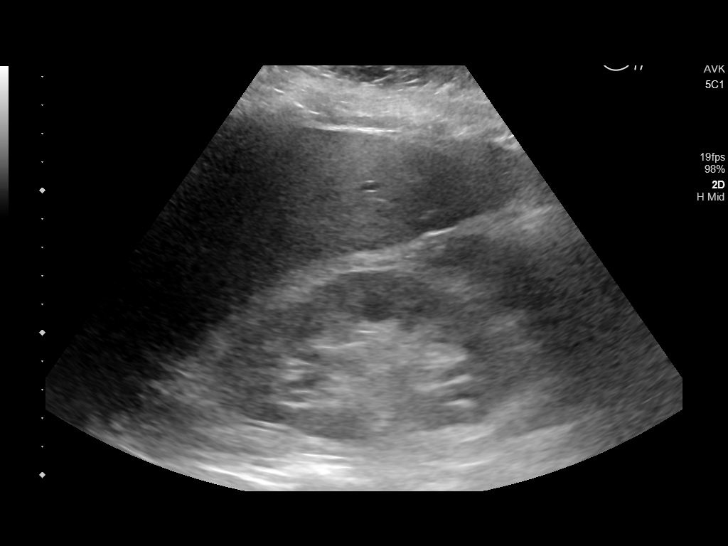
[im 6/59]
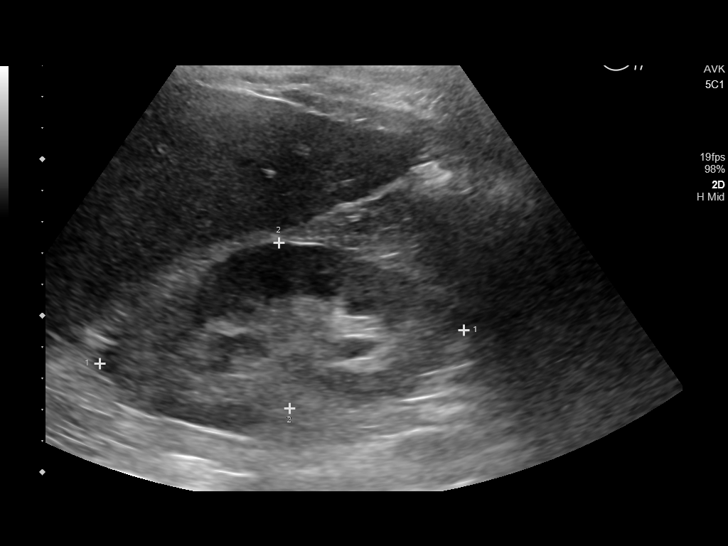
[im 11/59]
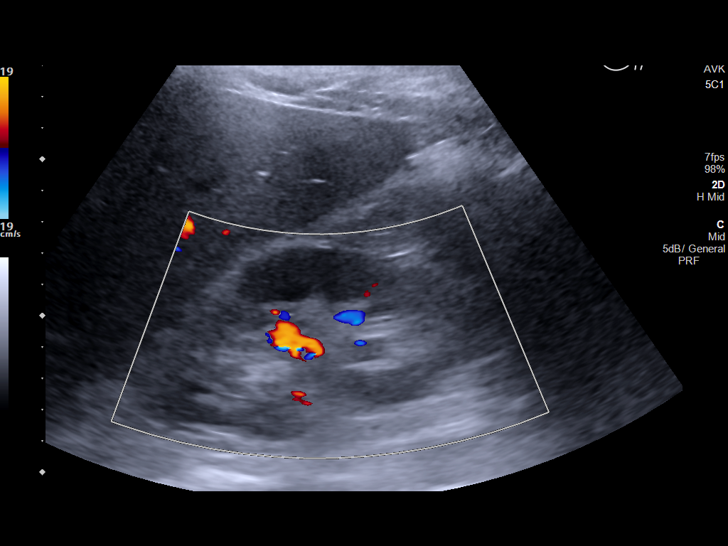
[im 16/59]
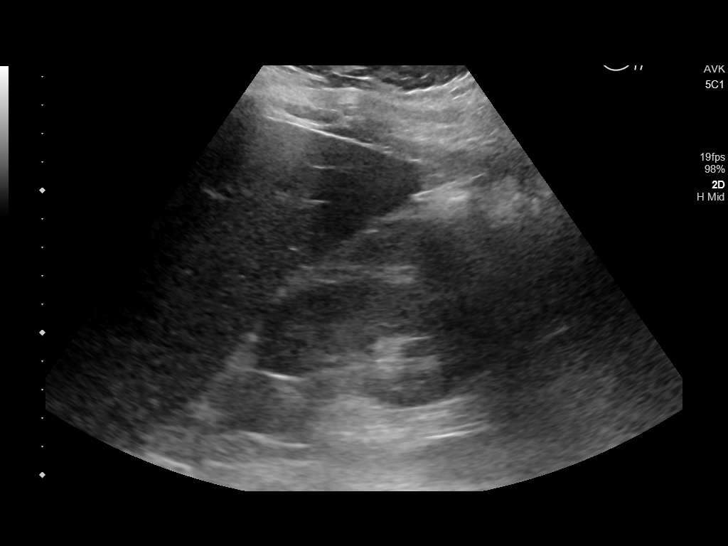
[im 21/59]
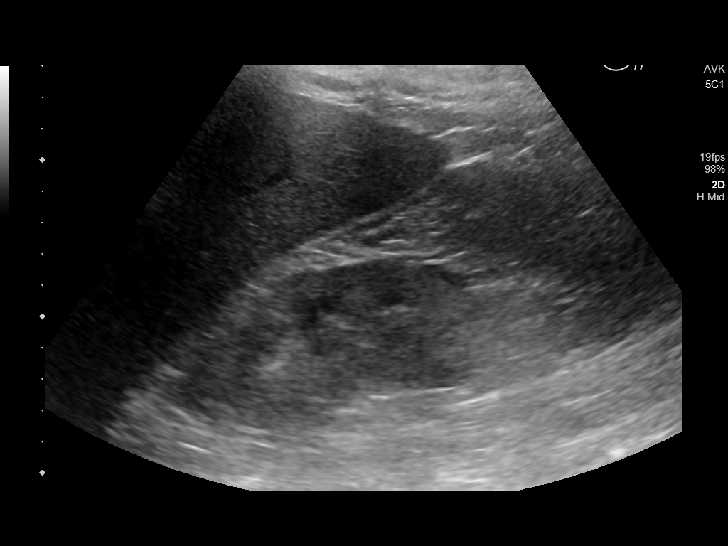
[im 23/59]
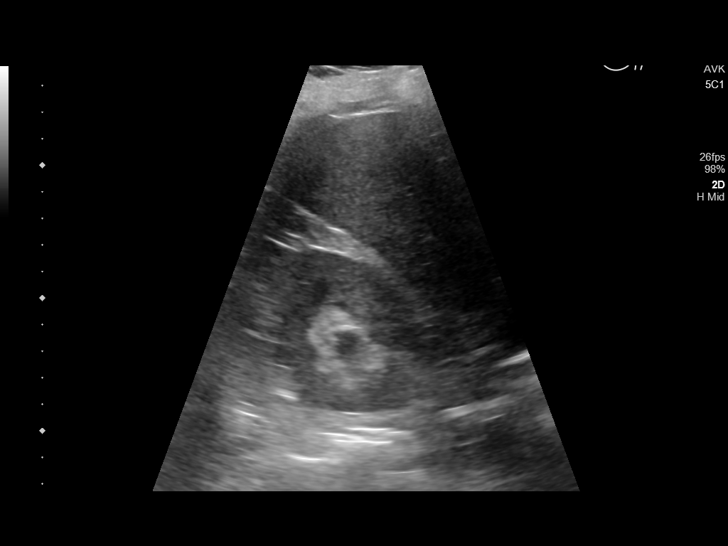
[im 28/59]
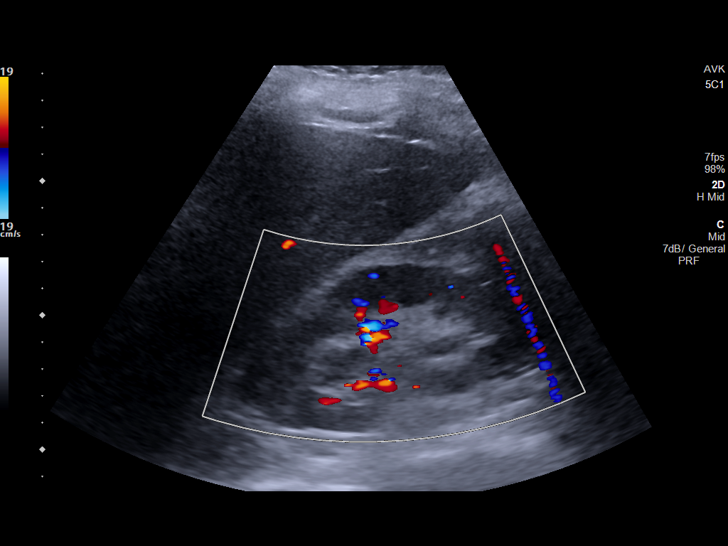
[im 33/59]
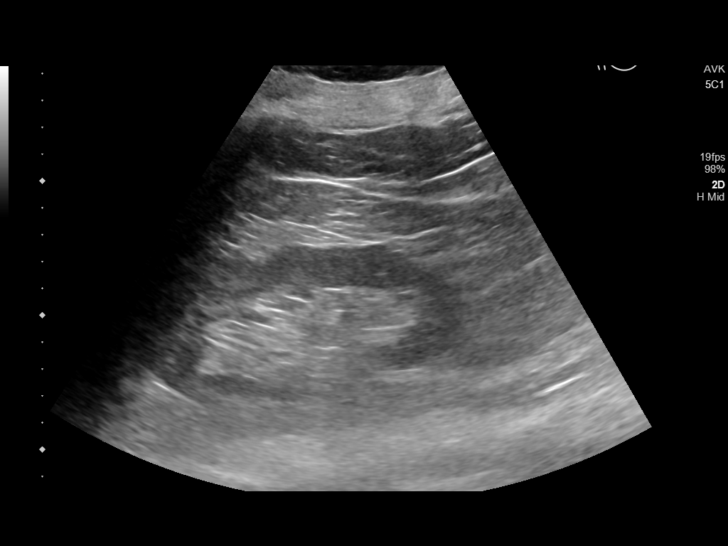
[im 38/59]
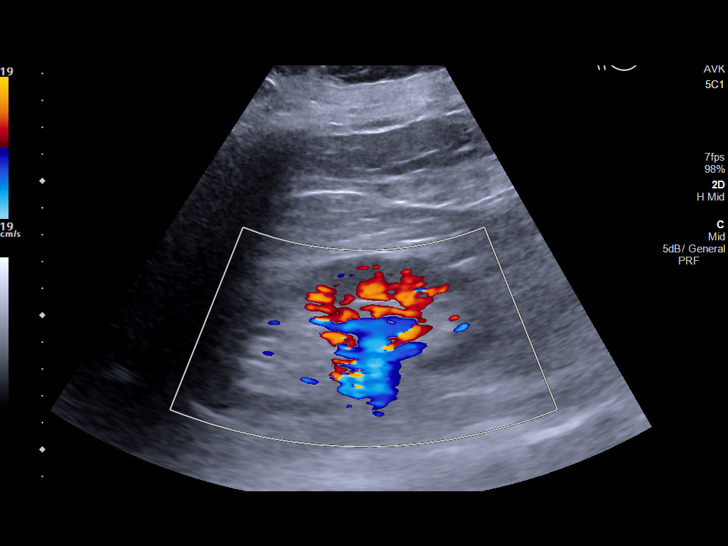
[im 41/59]
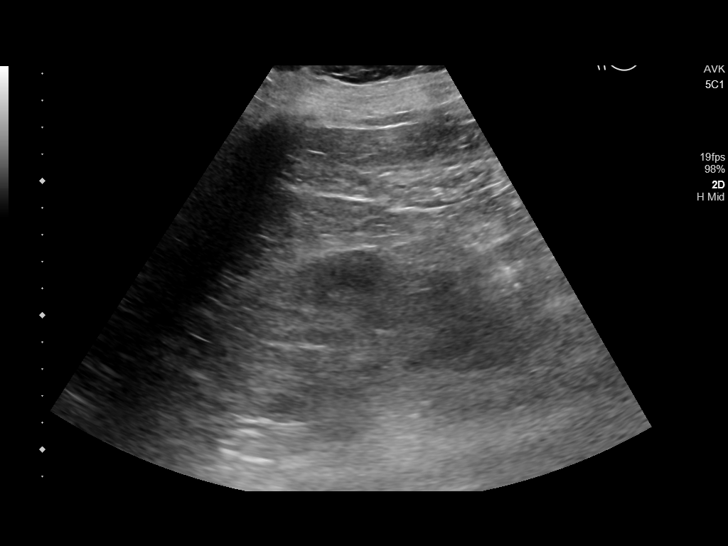
[im 46/59]
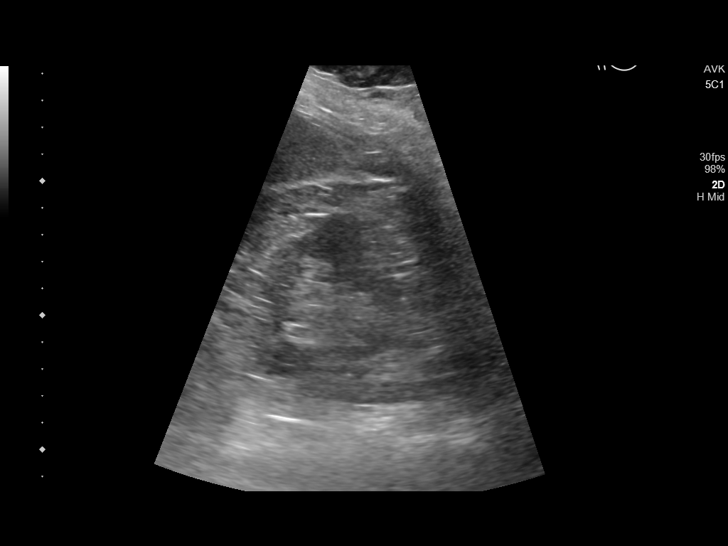
[im 51/59]
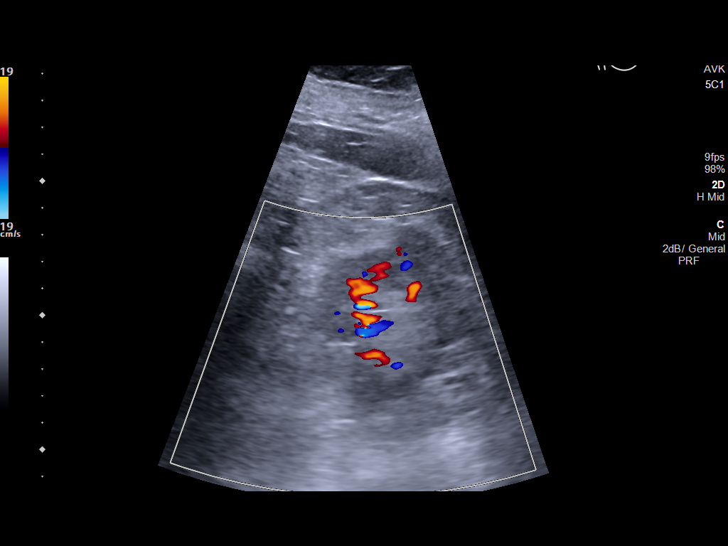
[im 56/59]
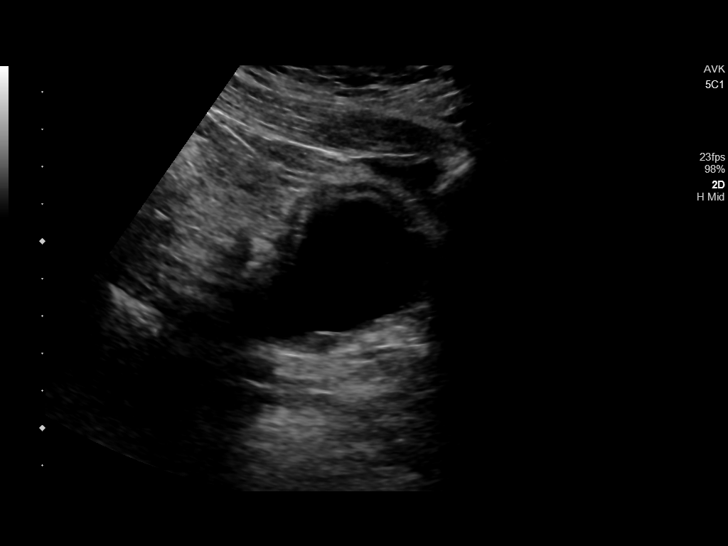

[Series 1001: us renal · 1 of 1 slices shown (2 of 2)]
[im 1/1]
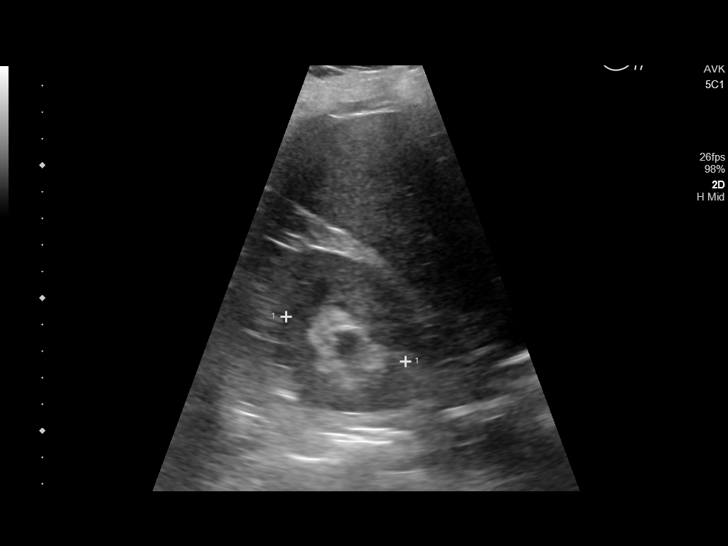

[14 of 25 positions shown; findings below may reference images not displayed]

FINDINGS: Right Kidney:

Renal measurements: 11.7 x 5.3 x 4.8 cm = volume: 156 mL. Mild right
hydronephrosis. No mass. Normal echotexture.

Left Kidney:

Renal measurements: 11.8 x 5.7 x 5.0 cm = volume: 177 mL.
Echogenicity within normal limits. No mass or hydronephrosis
visualized.

Bladder:

Appears normal for degree of bladder distention.
IMPRESSION: Mild right hydronephrosis.

## 2022-04-09 ENCOUNTER — Emergency Department (HOSPITAL_COMMUNITY)
Admission: EM | Admit: 2022-04-09 | Discharge: 2022-04-10 | Disposition: A | Discharge status: Home / Self Care | Payer: BC Managed Care – PPO | Attending: Emergency Medicine | Admitting: Emergency Medicine

## 2022-04-09 ENCOUNTER — Other Ambulatory Visit: Payer: Self-pay

## 2022-04-09 DIAGNOSIS — R1032 Left lower quadrant pain: Secondary | ICD-10-CM | POA: Diagnosis present

## 2022-04-09 DIAGNOSIS — N12 Tubulo-interstitial nephritis, not specified as acute or chronic: Secondary | ICD-10-CM | POA: Insufficient documentation

## 2022-04-09 LAB — URINALYSIS, ROUTINE W REFLEX MICROSCOPIC
Bilirubin Urine: NEGATIVE
Glucose, UA: NEGATIVE mg/dL
Ketones, ur: NEGATIVE mg/dL
Nitrite: NEGATIVE
Protein, ur: NEGATIVE mg/dL
RBC / HPF: >50 RBC/hpf — ABNORMAL HIGH (ref 0–5)
Specific Gravity, Urine: 1.019 (ref 1.005–1.030)
pH: 6 (ref 5.0–8.0)

## 2022-04-09 LAB — BASIC METABOLIC PANEL
Anion gap: 9 (ref 5–15)
BUN: 12 mg/dL (ref 6–20)
CO2: 22 mmol/L (ref 22–32)
Calcium: 8.7 mg/dL — ABNORMAL LOW (ref 8.9–10.3)
Chloride: 106 mmol/L (ref 98–111)
Creatinine, Ser: 0.51 mg/dL (ref 0.44–1.00)
GFR, Estimated: >60 mL/min (ref >60)
Glucose, Bld: 143 mg/dL — ABNORMAL HIGH (ref 70–99)
Potassium: 3.8 mmol/L (ref 3.5–5.1)
Sodium: 137 mmol/L (ref 135–145)

## 2022-04-09 LAB — CBC
HCT: 40.5 % (ref 36.0–46.0)
Hemoglobin: 13.1 g/dL (ref 12.0–15.0)
MCH: 26.8 pg (ref 26.0–34.0)
MCHC: 32.3 g/dL (ref 30.0–36.0)
MCV: 82.8 fL (ref 80.0–100.0)
Platelets: 477 10*3/uL — ABNORMAL HIGH (ref 150–400)
RBC: 4.89 MIL/uL (ref 3.87–5.11)
RDW: 13.5 % (ref 11.5–15.5)
WBC: 18.3 10*3/uL — ABNORMAL HIGH (ref 4.0–10.5)
nRBC: 0 % (ref 0.0–0.2)

## 2022-04-09 LAB — CBG MONITORING, ED: Glucose-Capillary: 187 mg/dL — ABNORMAL HIGH (ref 70–99)

## 2022-04-09 MED ORDER — ONDANSETRON 4 MG PO TBDP
4.0000 mg | ORAL_TABLET | Freq: Once | ORAL | Status: AC | PRN
  Administered 2022-04-09: 4 mg via ORAL
  Filled 2022-04-09: qty 1

## 2022-04-09 NOTE — ED Triage Notes (Signed)
Pt c/o left flank pain and N&V. Pt has hx of kidney stones.

## 2022-04-10 ENCOUNTER — Emergency Department (HOSPITAL_COMMUNITY): Payer: BC Managed Care – PPO

## 2022-04-10 LAB — LACTIC ACID, PLASMA
Lactic Acid, Venous: 2.6 mmol/L (ref 0.5–1.9)
Lactic Acid, Venous: 2.3 mmol/L (ref 0.5–1.9)

## 2022-04-10 LAB — PREGNANCY, URINE: Preg Test, Ur: NEGATIVE

## 2022-04-10 MED ORDER — FENTANYL CITRATE (PF) 100 MCG/2ML IJ SOLN
100.0000 ug | Freq: Once | INTRAMUSCULAR | Status: DC
  Filled 2022-04-10: qty 2

## 2022-04-10 MED ORDER — LACTATED RINGERS IV BOLUS
1000.0000 mL | Freq: Once | INTRAVENOUS | Status: AC
  Administered 2022-04-10: 1000 mL via INTRAVENOUS

## 2022-04-10 MED ORDER — ONDANSETRON 4 MG PO TBDP: 4.0000 mg | ORAL_TABLET | Freq: Three times a day (TID) | ORAL | 0 refills | Status: DC | PRN

## 2022-04-10 MED ORDER — ONDANSETRON HCL 4 MG/2ML IJ SOLN
4.0000 mg | Freq: Once | INTRAMUSCULAR | Status: AC
  Administered 2022-04-10: 4 mg via INTRAVENOUS
  Filled 2022-04-10: qty 2

## 2022-04-10 MED ORDER — SODIUM CHLORIDE 0.9 % IV BOLUS
1000.0000 mL | Freq: Once | INTRAVENOUS | Status: AC
  Administered 2022-04-10: 1000 mL via INTRAVENOUS

## 2022-04-10 MED ORDER — ACETAMINOPHEN 325 MG PO TABS
650.0000 mg | ORAL_TABLET | Freq: Once | ORAL | Status: AC
  Administered 2022-04-10: 650 mg via ORAL
  Filled 2022-04-10: qty 2

## 2022-04-10 MED ORDER — SODIUM CHLORIDE 0.9 % IV SOLN
1.0000 g | Freq: Once | INTRAVENOUS | Status: AC
  Administered 2022-04-10: 1 g via INTRAVENOUS
  Filled 2022-04-10: qty 10

## 2022-04-10 MED ORDER — CEPHALEXIN 500 MG PO CAPS: 500.0000 mg | ORAL_CAPSULE | Freq: Four times a day (QID) | ORAL | 0 refills | Status: DC

## 2022-04-10 NOTE — ED Notes (Signed)
Pt alert, oriented, and ambulatory upon DC. Bunyan Brier Daleena Rotter,RN 

## 2022-04-10 NOTE — ED Provider Notes (Signed)
[facility] EMERGENCY DEPARTMENT Provider Note   CSN: [id number] Arrival date & time: 04/09/22  1834     History  Chief Complaint  Patient presents with   Flank Pain    Rhonda Cohen is a 26 y.o. female.  The history is provided by the patient and the spouse.  Flank Pain This is a new problem. The problem occurs constantly. The problem has been gradually worsening. Nothing relieves the symptoms.  Patient with history of PCOS, diabetes presents with flank pain She reports over the past several hours she has had left flank pain and left lower quadrant pain.  She reports nausea and vomiting.  No dysuria.  No fevers.  Reports it feels similar to prior episodes of kidney stones She has never required urologic surgery   Past Medical History:  Diagnosis Date   Musculoskeletal abnormal finding on examination    congenital bilateral wrist deformities   PCOS (polycystic ovarian syndrome)     Home Medications Prior to Admission medications   Medication Sig Start Date End Date Taking? Authorizing Provider  cephALEXin (KEFLEX) 500 MG capsule Take 1 capsule (500 mg total) by mouth 4 (four) times daily. 04/10/22  Yes Zadie Rhine, MD  ondansetron (ZOFRAN-ODT) 4 MG disintegrating tablet Take 1 tablet (4 mg total) by mouth every 8 (eight) hours as needed. 04/10/22  Yes Zadie Rhine, MD      Allergies    Sulfa antibiotics    Review of Systems   Review of Systems  Gastrointestinal:  Positive for nausea and vomiting.  Genitourinary:  Positive for flank pain.    Physical Exam Updated Vital Signs BP 103/66   Pulse (!) 102   Temp 97.7 F (36.5 C) (Oral)   Resp 19   Ht 1.524 m (5')   Wt 99.8 kg   LMP 04/02/2022 (Exact Date)   SpO2 93%   BMI 42.97 kg/m  Physical Exam CONSTITUTIONAL: Well developed/well nourished HEAD: Normocephalic/atraumatic EYES: EOMI/PERRL ENMT: Mucous membranes moist NECK: supple no meningeal signs SPINE/BACK:entire spine nontender CV: S1/S2  noted, no murmurs/rubs/gallops noted, tachycardic LUNGS: Lungs are clear to auscultation bilaterally, no apparent distress ABDOMEN: soft, mild LLQ tenderness, no rebound or guarding, bowel sounds noted throughout abdomen GU: Left cva tenderness NEURO: Pt is awake/alert/appropriate, moves all extremitiesx4.  No facial droop.   EXTREMITIES: pulses normal/equal, full ROM, well-healing ingrown toenail on left great toe SKIN: warm, color normal PSYCH: no abnormalities of mood noted, alert and oriented to situation  ED Results / Procedures / Treatments   Labs (all labs ordered are listed, but only abnormal results are displayed) Labs Reviewed  URINALYSIS, ROUTINE W REFLEX MICROSCOPIC - Abnormal; Notable for the following components:      Result Value   APPearance HAZY (*)    Hgb urine dipstick LARGE (*)    Leukocytes,Ua MODERATE (*)    RBC / HPF >50 (*)    Bacteria, UA RARE (*)    All other components within normal limits  BASIC METABOLIC PANEL - Abnormal; Notable for the following components:   Glucose, Bld 143 (*)    Calcium 8.7 (*)    All other components within normal limits  CBC - Abnormal; Notable for the following components:   WBC 18.3 (*)    Platelets 477 (*)    All other components within normal limits  LACTIC ACID, PLASMA - Abnormal; Notable for the following components:   Lactic Acid, Venous 2.6 (*)    All other components within normal limits  LACTIC ACID,  PLASMA - Abnormal; Notable for the following components:   Lactic Acid, Venous 2.3 (*)    All other components within normal limits  CBG MONITORING, ED - Abnormal; Notable for the following components:   Glucose-Capillary 187 (*)    All other components within normal limits  CULTURE, BLOOD (ROUTINE X 2)  CULTURE, BLOOD (ROUTINE X 2)  URINE CULTURE  PREGNANCY, URINE  POC URINE PREG, ED    EKG None  Radiology CT Renal Stone Study  Result Date: 04/10/2022 CLINICAL DATA:  Urolithiasis, symptomatic. c/o left  flank pain x 12 hours, hematuria and NANDV. Pt has hx of kidney stones EXAM: CT ABDOMEN AND PELVIS WITHOUT CONTRAST TECHNIQUE: Multidetector CT imaging of the abdomen and pelvis was performed following the standard protocol without IV contrast. RADIATION DOSE REDUCTION: This exam was performed according to the departmental dose-optimization program which includes automated exposure control, adjustment of the mA and/or kV according to patient size and/or use of iterative reconstruction technique. COMPARISON:  CT abdomen pelvis 03/02/2020 FINDINGS: Lower chest: No acute abnormality.  Tiny hiatal hernia. Hepatobiliary: The liver is enlarged measuring up to 21 cm. No focal liver abnormality. No gallstones, gallbladder wall thickening, or pericholecystic fluid. No biliary dilatation. Pancreas: No focal lesion. Normal pancreatic contour. No surrounding inflammatory changes. No main pancreatic ductal dilatation. Spleen: Normal in size without focal abnormality. Adrenals/Urinary Tract: No adrenal nodule bilaterally. No nephrolithiasis and no hydronephrosis. Punctate nephrolithiasis bilaterally. No ureterolithiasis or hydroureter. The urinary bladder is unremarkable. Stomach/Bowel: Stomach is within normal limits. No evidence of bowel wall thickening or dilatation. Appendix appears normal. Vascular/Lymphatic: No abdominal aorta or iliac aneurysm. No abdominal, pelvic, or inguinal lymphadenopathy. Reproductive: Uterus and bilateral adnexa are unremarkable. Other: No intraperitoneal free fluid. No intraperitoneal free gas. No organized fluid collection. Musculoskeletal: No abdominal wall hernia or abnormality. No suspicious lytic or blastic osseous lesions. No acute displaced fracture. IMPRESSION: 1. Tiny hiatal hernia. 2. Nonobstructive punctate bilateral nephrolithiasis. 3. Hepatomegaly. Electronically Signed   By: Tish Frederickson M.D.   On: 04/10/2022 01:28    Procedures Procedures    Medications Ordered in  ED Medications  ondansetron (ZOFRAN-ODT) disintegrating tablet 4 mg (4 mg Oral Given 04/09/22 2004)  cefTRIAXone (ROCEPHIN) 1 g in sodium chloride 0.9 % 100 mL IVPB (0 g Intravenous Stopped 04/10/22 0252)  sodium chloride 0.9 % bolus 1,000 mL (0 mLs Intravenous Stopped 04/10/22 0217)  ondansetron (ZOFRAN) injection 4 mg (4 mg Intravenous Given 04/10/22 0131)  acetaminophen (TYLENOL) tablet 650 mg (650 mg Oral Given 04/10/22 0154)  lactated ringers bolus 1,000 mL (0 mLs Intravenous Stopped 04/10/22 0514)    ED Course/ Medical Decision Making/ A&P Clinical Course as of 04/10/22 0529  Mon Apr 10, 2022  0044 WBC(!): 18.3 Leukocytosis [DW]  0044 Glucose-Capillary(!): 187 Hyperglycemia [DW]  0044 Hgb urine dipstick(!): LARGE Hematuria [DW]  0230 No acute urologic issues on CT imaging.  Suspect this is early pyelonephritis.  Lactate was mildly elevated.  Patient has been given IV fluids and IV antibiotics.  Will continue to monitor [DW]  0528 Overall patient appears improved.  She was sleeping and has no new complaints.  Resting heart rate was around 100.  She denies any chest pain or shortness of breath.  She denies any abdominal pain or back pain.  Lactate was improved though not less than 2.  However she feels well and would like to be discharged home. [DW]  5093 Plan to discharge home on Keflex for presumed pyelonephritis.  We discussed strict return  precautions [DW]    Clinical Course User Index [DW] Zadie Rhine, MD                           Medical Decision Making Amount and/or Complexity of Data Reviewed Labs: ordered. Decision-making details documented in ED Course. Radiology: ordered.  Risk OTC drugs. Prescription drug management.   This patient presents to the ED for concern of abdominal pain and flank pain, this involves an extensive number of treatment options, and is a complaint that carries with it a high risk of complications and morbidity.  The differential  diagnosis includes but is not limited to cholecystitis, cholelithiasis, pancreatitis, gastritis, peptic ulcer disease, appendicitis, bowel obstruction, bowel perforation, diverticulitis, AAA, ischemic bowel, UTI, kidney stone    Comorbidities that complicate the patient evaluation: Patient's presentation is complicated by their history of obesity and diabetes   Additional history obtained: Additional history obtained from spouse Records reviewed Care Everywhere/External Records  Lab Tests: I Ordered, and personally interpreted labs.  The pertinent results include: Leukocytosis, hyperglycemia  Imaging Studies ordered: I ordered imaging studies including CT scan renal   I independently visualized and interpreted imaging which showed no ureteral stone I agree with the radiologist interpretation  Cardiac Monitoring: The patient was maintained on a cardiac monitor.  I personally viewed and interpreted the cardiac monitor which showed an underlying rhythm of:  sinus tachycardia  Medicines ordered and prescription drug management: I ordered medication including fentanyl for pain Reevaluation of the patient after these medicines showed that the patient    improved  Critical Interventions:   IV Rocephin   Reevaluation: After the interventions noted above, I reevaluated the patient and found that they have :improved  Complexity of problems addressed: Patient's presentation is most consistent with  acute presentation with potential threat to life or bodily function  Disposition: After consideration of the diagnostic results and the patient's response to treatment,  I feel that the patent would benefit from discharge   .           Final Clinical Impression(s) / ED Diagnoses Final diagnoses:  Pyelonephritis    Rx / DC Orders ED Discharge Orders          Ordered    ondansetron (ZOFRAN-ODT) 4 MG disintegrating tablet  Every 8 hours PRN        04/10/22 0356    cephALEXin  (KEFLEX) 500 MG capsule  4 times daily        04/10/22 0356              Zadie Rhine, MD 04/10/22 [phone]

## 2022-04-11 LAB — URINE CULTURE: Culture: NO GROWTH

## 2022-04-15 LAB — CULTURE, BLOOD (ROUTINE X 2)
Culture: NO GROWTH
Culture: NO GROWTH
Special Requests: ADEQUATE
Special Requests: ADEQUATE

## 2022-11-29 DIAGNOSIS — R102 Pelvic and perineal pain: Secondary | ICD-10-CM | POA: Diagnosis not present

## 2022-11-29 DIAGNOSIS — K219 Gastro-esophageal reflux disease without esophagitis: Secondary | ICD-10-CM | POA: Diagnosis not present

## 2022-11-29 DIAGNOSIS — O2692 Pregnancy related conditions, unspecified, second trimester: Secondary | ICD-10-CM | POA: Diagnosis not present

## 2022-11-29 DIAGNOSIS — Z3A20 20 weeks gestation of pregnancy: Secondary | ICD-10-CM | POA: Diagnosis not present

## 2022-11-29 LAB — HEPATITIS C ANTIBODY: HCV Ab: NEGATIVE

## 2022-11-29 LAB — OB RESULTS CONSOLE HEPATITIS B SURFACE ANTIGEN: Hepatitis B Surface Ag: NEGATIVE

## 2022-11-29 LAB — OB RESULTS CONSOLE RUBELLA ANTIBODY, IGM: Rubella: IMMUNE

## 2022-11-29 LAB — OB RESULTS CONSOLE RPR: RPR: NONREACTIVE

## 2022-11-29 LAB — OB RESULTS CONSOLE GC/CHLAMYDIA
Chlamydia: NEGATIVE
Neisseria Gonorrhea: NEGATIVE

## 2022-11-29 LAB — OB RESULTS CONSOLE HIV ANTIBODY (ROUTINE TESTING): HIV: NONREACTIVE

## 2022-11-29 LAB — OB RESULTS CONSOLE ANTIBODY SCREEN: Antibody Screen: NEGATIVE

## 2022-12-21 ENCOUNTER — Other Ambulatory Visit (HOSPITAL_COMMUNITY): Payer: Self-pay

## 2022-12-21 MED ORDER — INSULIN GLARGINE 100 UNIT/ML ~~LOC~~ SOLN
20.0000 [IU] | Freq: Two times a day (BID) | SUBCUTANEOUS | 6 refills | Status: DC
  Filled 2022-12-21: qty 20, 50d supply, fill #0

## 2022-12-21 MED ORDER — OMEPRAZOLE 40 MG PO CPDR
40.0000 mg | DELAYED_RELEASE_CAPSULE | Freq: Every day | ORAL | 3 refills | Status: DC
  Filled 2022-12-21: qty 90, 90d supply, fill #0

## 2022-12-21 MED ORDER — FLUOXETINE HCL 20 MG PO CAPS
20.0000 mg | ORAL_CAPSULE | Freq: Every day | ORAL | 3 refills | Status: DC
  Filled 2022-12-21: qty 90, 90d supply, fill #0

## 2022-12-27 ENCOUNTER — Other Ambulatory Visit (HOSPITAL_COMMUNITY): Payer: Self-pay

## 2022-12-29 ENCOUNTER — Other Ambulatory Visit (HOSPITAL_COMMUNITY): Payer: Self-pay

## 2023-01-05 ENCOUNTER — Other Ambulatory Visit (HOSPITAL_COMMUNITY): Payer: Self-pay

## 2023-01-22 ENCOUNTER — Inpatient Hospital Stay (HOSPITAL_COMMUNITY)
Admission: AD | Admit: 2023-01-22 | Discharge: 2023-01-23 | Disposition: A | Discharge status: Home / Self Care | Payer: BC Managed Care – PPO | Attending: Obstetrics and Gynecology | Admitting: Obstetrics and Gynecology

## 2023-01-22 DIAGNOSIS — O4702 False labor before 37 completed weeks of gestation, second trimester: Secondary | ICD-10-CM

## 2023-01-22 DIAGNOSIS — N390 Urinary tract infection, site not specified: Secondary | ICD-10-CM | POA: Insufficient documentation

## 2023-01-22 DIAGNOSIS — O4703 False labor before 37 completed weeks of gestation, third trimester: Secondary | ICD-10-CM | POA: Insufficient documentation

## 2023-01-22 DIAGNOSIS — Z3A28 28 weeks gestation of pregnancy: Secondary | ICD-10-CM | POA: Insufficient documentation

## 2023-01-22 DIAGNOSIS — O2343 Unspecified infection of urinary tract in pregnancy, third trimester: Secondary | ICD-10-CM | POA: Insufficient documentation

## 2023-01-22 DIAGNOSIS — R110 Nausea: Secondary | ICD-10-CM | POA: Insufficient documentation

## 2023-01-22 DIAGNOSIS — O26899 Other specified pregnancy related conditions, unspecified trimester: Secondary | ICD-10-CM

## 2023-01-22 DIAGNOSIS — Z1152 Encounter for screening for COVID-19: Secondary | ICD-10-CM | POA: Insufficient documentation

## 2023-01-22 HISTORY — DX: Hyperlipidemia, unspecified: E78.5

## 2023-01-22 HISTORY — DX: Depression, unspecified: F32.A

## 2023-01-22 HISTORY — DX: Type 2 diabetes mellitus without complications: E11.9

## 2023-01-22 HISTORY — DX: Anxiety disorder, unspecified: F41.9

## 2023-01-23 ENCOUNTER — Encounter (HOSPITAL_COMMUNITY): Payer: Self-pay | Admitting: *Deleted

## 2023-01-23 DIAGNOSIS — J00 Acute nasopharyngitis [common cold]: Secondary | ICD-10-CM | POA: Diagnosis present

## 2023-01-23 DIAGNOSIS — Z1152 Encounter for screening for COVID-19: Secondary | ICD-10-CM | POA: Diagnosis not present

## 2023-01-23 DIAGNOSIS — I10 Essential (primary) hypertension: Secondary | ICD-10-CM | POA: Diagnosis present

## 2023-01-23 DIAGNOSIS — N390 Urinary tract infection, site not specified: Secondary | ICD-10-CM | POA: Diagnosis not present

## 2023-01-23 DIAGNOSIS — O4703 False labor before 37 completed weeks of gestation, third trimester: Secondary | ICD-10-CM | POA: Diagnosis not present

## 2023-01-23 DIAGNOSIS — O2343 Unspecified infection of urinary tract in pregnancy, third trimester: Secondary | ICD-10-CM | POA: Diagnosis not present

## 2023-01-23 DIAGNOSIS — O4702 False labor before 37 completed weeks of gestation, second trimester: Secondary | ICD-10-CM | POA: Diagnosis not present

## 2023-01-23 DIAGNOSIS — Z3A28 28 weeks gestation of pregnancy: Secondary | ICD-10-CM | POA: Diagnosis not present

## 2023-01-23 DIAGNOSIS — R110 Nausea: Secondary | ICD-10-CM | POA: Diagnosis not present

## 2023-01-23 LAB — COMPREHENSIVE METABOLIC PANEL
ALT: 12 U/L (ref 0–44)
AST: 16 U/L (ref 15–41)
Albumin: 2.2 g/dL — ABNORMAL LOW (ref 3.5–5.0)
Alkaline Phosphatase: 61 U/L (ref 38–126)
Anion gap: 13 (ref 5–15)
BUN: <5 mg/dL — ABNORMAL LOW (ref 6–20)
CO2: 21 mmol/L — ABNORMAL LOW (ref 22–32)
Calcium: 8.9 mg/dL (ref 8.9–10.3)
Chloride: 102 mmol/L (ref 98–111)
Creatinine, Ser: 0.6 mg/dL (ref 0.44–1.00)
GFR, Estimated: >60 mL/min (ref >60)
Glucose, Bld: 133 mg/dL — ABNORMAL HIGH (ref 70–99)
Potassium: 3.7 mmol/L (ref 3.5–5.1)
Sodium: 136 mmol/L (ref 135–145)
Total Bilirubin: 0.4 mg/dL (ref 0.3–1.2)
Total Protein: 6.1 g/dL — ABNORMAL LOW (ref 6.5–8.1)

## 2023-01-23 LAB — RESP PANEL BY RT-PCR (RSV, FLU A&B, COVID)  RVPGX2
Influenza A by PCR: NEGATIVE
Influenza B by PCR: NEGATIVE
Resp Syncytial Virus by PCR: NEGATIVE
SARS Coronavirus 2 by RT PCR: NEGATIVE

## 2023-01-23 LAB — URINALYSIS, ROUTINE W REFLEX MICROSCOPIC
Bilirubin Urine: NEGATIVE
Glucose, UA: 150 mg/dL — AB
Hgb urine dipstick: NEGATIVE
Ketones, ur: NEGATIVE mg/dL
Nitrite: NEGATIVE
Protein, ur: NEGATIVE mg/dL
Specific Gravity, Urine: 1.015 (ref 1.005–1.030)
pH: 6 (ref 5.0–8.0)

## 2023-01-23 LAB — CBC WITH DIFFERENTIAL/PLATELET
Abs Immature Granulocytes: 0.16 10*3/uL — ABNORMAL HIGH (ref 0.00–0.07)
Basophils Absolute: 0 10*3/uL (ref 0.0–0.1)
Basophils Relative: 0 %
Eosinophils Absolute: 0.2 10*3/uL (ref 0.0–0.5)
Eosinophils Relative: 1 %
HCT: 32 % — ABNORMAL LOW (ref 36.0–46.0)
Hemoglobin: 10.4 g/dL — ABNORMAL LOW (ref 12.0–15.0)
Immature Granulocytes: 1 %
Lymphocytes Relative: 19 %
Lymphs Abs: 2.9 10*3/uL (ref 0.7–4.0)
MCH: 26.1 pg (ref 26.0–34.0)
MCHC: 32.5 g/dL (ref 30.0–36.0)
MCV: 80.4 fL (ref 80.0–100.0)
Monocytes Absolute: 0.9 10*3/uL (ref 0.1–1.0)
Monocytes Relative: 6 %
Neutro Abs: 11.3 10*3/uL — ABNORMAL HIGH (ref 1.7–7.7)
Neutrophils Relative %: 73 %
Platelets: 389 10*3/uL (ref 150–400)
RBC: 3.98 MIL/uL (ref 3.87–5.11)
RDW: 14.2 % (ref 11.5–15.5)
WBC: 15.4 10*3/uL — ABNORMAL HIGH (ref 4.0–10.5)
nRBC: 0 % (ref 0.0–0.2)

## 2023-01-23 MED ORDER — ONDANSETRON 4 MG PO TBDP
8.0000 mg | ORAL_TABLET | Freq: Once | ORAL | Status: AC
  Administered 2023-01-23: 8 mg via ORAL
  Filled 2023-01-23: qty 2

## 2023-01-23 MED ORDER — FOSFOMYCIN TROMETHAMINE 3 G PO PACK
3.0000 g | PACK | Freq: Once | ORAL | 0 refills | Status: AC
Start: 2023-01-23 — End: 2023-01-23

## 2023-01-23 MED ORDER — TERBUTALINE SULFATE 1 MG/ML IJ SOLN
0.2500 mg | Freq: Once | INTRAMUSCULAR | Status: AC
  Administered 2023-01-23: 0.25 mg via SUBCUTANEOUS
  Filled 2023-01-23: qty 1

## 2023-01-23 MED ORDER — NIFEDIPINE 10 MG PO CAPS
10.0000 mg | ORAL_CAPSULE | Freq: Four times a day (QID) | ORAL | 1 refills | Status: DC
Start: 2023-01-23 — End: 2023-03-27

## 2023-01-23 MED ORDER — NIFEDIPINE 10 MG PO CAPS
10.0000 mg | ORAL_CAPSULE | ORAL | Status: AC
  Administered 2023-01-23 (×3): 10 mg via ORAL
  Filled 2023-01-23 (×3): qty 1

## 2023-01-23 MED ORDER — LACTATED RINGERS IV BOLUS
1000.0000 mL | Freq: Once | INTRAVENOUS | Status: AC
  Administered 2023-01-23: 1000 mL via INTRAVENOUS

## 2023-01-23 NOTE — MAU Note (Signed)
Pt says No UC Says - thought she had a head cold-but BP at 2330- 155/90 She works for [employer] - they checked BP Has been nausea x3 hrs.  No fever at home  [facility] - transferred fro [facility]

## 2023-01-23 NOTE — MAU Provider Note (Signed)
History     CSN: [id number]  Arrival date and time: 01/22/23 2354   Event Date/Time   First Provider Initiated Contact with Patient 01/23/23 0153      Chief Complaint  Patient presents with   Hypertension   HPI [name] is a 27 y.o. year old G1P0 female at [redacted]w[redacted]d weeks gestation who presents to MAU reporting she felt like she was getting a head cold. She works at [employer] and had someone there take her BP. She reports her BP was 155/90 there. She also reports feeling nauseated x the past 3 hours. She has not taken anything for the nausea. She denies any fever, but "I have been getting hot & flushed then cold off and on."  She reports this is the same when she felt when she was septic last year.  She is requesting to have blood work done to test for sepsis.  She receives [facility] with [facility]; next appt is 01/25/2023.    OB History     Gravida  1   Para      Term      Preterm      AB      Living         SAB      IAB      Ectopic      Multiple      Live Births              Past Medical History:  Diagnosis Date   Anxiety    Depression    Hyperlipidemia    Musculoskeletal abnormal finding on examination    congenital bilateral wrist deformities   PCOS (polycystic ovarian syndrome)    Type 2 diabetes mellitus (HCC)     Past Surgical History:  Procedure Laterality Date   TONSILLECTOMY      Family History  Problem Relation Age of Onset   Cancer Mother    Heart attack Father    Down syndrome Sister    Cancer Maternal Grandmother     Social History   Tobacco Use   Smoking status: Never   Smokeless tobacco: Current  Vaping Use   Vaping status: Former  Substance Use Topics   Alcohol use: Not Currently   Drug use: Never    Allergies:  Allergies  Allergen Reactions   Sulfa Antibiotics Other (See Comments)    Medications Prior to Admission  Medication Sig Dispense Refill Last Dose   FLUoxetine (PROZAC) 20 MG capsule Take 1  capsule (20 mg total) by mouth daily. 90 capsule 3 01/22/2023   insulin glargine (LANTUS) 100 UNIT/ML injection Inject 0.2 mLs (20 Units total) into the skin 2 (two) times daily. 20 mL 6 01/22/2023   omeprazole (PRILOSEC) 40 MG capsule Take 1 capsule (40 mg total) by mouth daily. 90 capsule 3 01/22/2023   cephALEXin (KEFLEX) 500 MG capsule Take 1 capsule (500 mg total) by mouth 4 (four) times daily. 28 capsule 0    ondansetron (ZOFRAN-ODT) 4 MG disintegrating tablet Take 1 tablet (4 mg total) by mouth every 8 (eight) hours as needed. 8 tablet 0     Review of Systems  Constitutional:  Positive for chills ("I go from hot to cold").  HENT: Negative.    Eyes: Negative.   Respiratory: Negative.    Cardiovascular: Negative.   Gastrointestinal:  Positive for nausea (x 3 hrs prior to arriving in MAU).  Endocrine: Negative.   Genitourinary: Negative.   Musculoskeletal: Negative.  Skin: Negative.   Allergic/Immunologic: Negative.   Neurological: Negative.   Hematological: Negative.   Psychiatric/Behavioral: Negative.     Physical Exam  Patient Vitals for the past 24 hrs:  BP Temp Temp src Pulse Resp SpO2 Height Weight  01/23/23 0216 116/73 -- -- (!) 102 -- 97 % -- --  01/23/23 0201 116/74 -- -- 100 -- 97 % -- --  01/23/23 0146 (!) 109/57 -- -- (!) 101 -- 97 % -- --  01/23/23 0131 120/81 -- -- (!) 107 -- 98 % -- --  01/23/23 0115 (!) 107/54 -- -- 100 -- 99 % -- --  01/23/23 0105 113/61 -- -- (!) 108 -- 98 % -- --  01/23/23 0042 132/73 97.9 F (36.6 C) Oral (!) 109 18 99 % 5' (1.524 m) 100.5 kg   Physical Exam Vitals and nursing note reviewed.  Constitutional:      Appearance: Normal appearance. She is obese.  Cardiovascular:     Rate and Rhythm: Regular rhythm. Tachycardia present.     Pulses: Normal pulses.     Heart sounds: Normal heart sounds.  Pulmonary:     Effort: Pulmonary effort is normal.     Breath sounds: Normal breath sounds.  Abdominal:     General: Bowel sounds are  normal.     Palpations: Abdomen is soft.  Genitourinary:    General: Normal vulva.     Comments: fFN swab obtained.  Dilation: Closed Effacement (%): Thick Cervical Position: Posterior Station: Ballotable Presentation: Undeterminable Exam by: Carloyn Jaeger, CNM  Musculoskeletal:        General: Normal range of motion.  Skin:    General: Skin is warm and dry.  Neurological:     Mental Status: She is alert and oriented to person, place, and time.  Psychiatric:        Mood and Affect: Mood normal.        Behavior: Behavior normal.        Thought Content: Thought content normal.        Judgment: Judgment normal.    REACTIVE NST - FHR: 130 bpm / moderate variability / accels present / decels absent / TOCO: irregular UCs with UI noted Reassessment @ 0525: Patient reports the contractions  are more painful and frequent. Reassessment @ [phone]: Patient not feeling contractions as painful and as frequent.  MAU Course  Procedures  MDM COVID/RSV/Flu CBC w/Diff CMP fFN -- obtained, but not sent d/t closed cx Start IV LR 1 liter bolus Procardia 10 mg every 20 mins x 3 doses Terbutaline 0.25 mg SQ  Results for orders placed or performed during the hospital encounter of 01/22/23 (from the past 24 hour(s))  Urinalysis, Routine w reflex microscopic -Urine, Clean Catch     Status: Abnormal   Collection Time: 01/23/23 12:59 AM  Result Value Ref Range   Color, Urine YELLOW YELLOW   APPearance CLOUDY (A) CLEAR   Specific Gravity, Urine 1.015 1.005 - 1.030   pH 6.0 5.0 - 8.0   Glucose, UA 150 (A) NEGATIVE mg/dL   Hgb urine dipstick NEGATIVE NEGATIVE   Bilirubin Urine NEGATIVE NEGATIVE   Ketones, ur NEGATIVE NEGATIVE mg/dL   Protein, ur NEGATIVE NEGATIVE mg/dL   Nitrite NEGATIVE NEGATIVE   Leukocytes,Ua LARGE (A) NEGATIVE   RBC / HPF 6-10 0 - 5 RBC/hpf   WBC, UA 11-20 0 - 5 WBC/hpf   Bacteria, UA FEW (A) NONE SEEN   Squamous Epithelial / HPF 21-50 0 - 5 /HPF  Mucus PRESENT   Resp  panel by RT-PCR (RSV, Flu A&B, Covid) Anterior Nasal Swab     Status: None   Collection Time: 01/23/23  2:02 AM   Specimen: Anterior Nasal Swab  Result Value Ref Range   SARS Coronavirus 2 by RT PCR NEGATIVE NEGATIVE   Influenza A by PCR NEGATIVE NEGATIVE   Influenza B by PCR NEGATIVE NEGATIVE   Resp Syncytial Virus by PCR NEGATIVE NEGATIVE  CBC with Differential/Platelet     Status: Abnormal   Collection Time: 01/23/23  2:48 AM  Result Value Ref Range   WBC 15.4 (H) 4.0 - 10.5 K/uL   RBC 3.98 3.87 - 5.11 MIL/uL   Hemoglobin 10.4 (L) 12.0 - 15.0 g/dL   HCT 16.1 (L) 09.6 - 04.5 %   MCV 80.4 80.0 - 100.0 fL   MCH 26.1 26.0 - 34.0 pg   MCHC 32.5 30.0 - 36.0 g/dL   RDW 40.9 81.1 - 91.4 %   Platelets 389 150 - 400 K/uL   nRBC 0.0 0.0 - 0.2 %   Neutrophils Relative % 73 %   Neutro Abs 11.3 (H) 1.7 - 7.7 K/uL   Lymphocytes Relative 19 %   Lymphs Abs 2.9 0.7 - 4.0 K/uL   Monocytes Relative 6 %   Monocytes Absolute 0.9 0.1 - 1.0 K/uL   Eosinophils Relative 1 %   Eosinophils Absolute 0.2 0.0 - 0.5 K/uL   Basophils Relative 0 %   Basophils Absolute 0.0 0.0 - 0.1 K/uL   Immature Granulocytes 1 %   Abs Immature Granulocytes 0.16 (H) 0.00 - 0.07 K/uL  Comprehensive metabolic panel     Status: Abnormal   Collection Time: 01/23/23  2:48 AM  Result Value Ref Range   Sodium 136 135 - 145 mmol/L   Potassium 3.7 3.5 - 5.1 mmol/L   Chloride 102 98 - 111 mmol/L   CO2 21 (L) 22 - 32 mmol/L   Glucose, Bld 133 (H) 70 - 99 mg/dL   BUN <5 (L) 6 - 20 mg/dL   Creatinine, Ser 7.82 0.44 - 1.00 mg/dL   Calcium 8.9 8.9 - 95.6 mg/dL   Total Protein 6.1 (L) 6.5 - 8.1 g/dL   Albumin 2.2 (L) 3.5 - 5.0 g/dL   AST 16 15 - 41 U/L   ALT 12 0 - 44 U/L   Alkaline Phosphatase 61 38 - 126 U/L   Total Bilirubin 0.4 0.3 - 1.2 mg/dL   GFR, Estimated >21 >30 mL/min   Anion gap 13 5 - 15    Assessment and Plan  1. Preterm uterine contractions in second trimester, antepartum - Information provided on PTL and  preventing PTB   2. Pregnancy related nausea, antepartum  3. UTI (urinary tract infection) during pregnancy, third trimester - Information provided on UTI in pregnancy - Rx: Fosfomycin 3g packet x 1 dose  4. [redacted] weeks gestation of pregnancy   - Discharge patient - Keep scheduled appt with [facility] on 01/25/2023 - Patient verbalized an understanding of the plan of care and agrees.   Raelyn Mora, CNM 01/23/2023, 1:53 AM

## 2023-01-24 LAB — CULTURE, OB URINE

## 2023-02-10 ENCOUNTER — Inpatient Hospital Stay (HOSPITAL_COMMUNITY)
Admission: AD | Admit: 2023-02-10 | Discharge: 2023-02-11 | Disposition: A | Discharge status: Home / Self Care | Payer: BC Managed Care – PPO | Attending: Obstetrics and Gynecology | Admitting: Obstetrics and Gynecology

## 2023-02-10 ENCOUNTER — Encounter (HOSPITAL_COMMUNITY): Payer: Self-pay | Admitting: Obstetrics and Gynecology

## 2023-02-10 DIAGNOSIS — O99283 Endocrine, nutritional and metabolic diseases complicating pregnancy, third trimester: Secondary | ICD-10-CM | POA: Insufficient documentation

## 2023-02-10 DIAGNOSIS — Z3A31 31 weeks gestation of pregnancy: Secondary | ICD-10-CM | POA: Insufficient documentation

## 2023-02-10 DIAGNOSIS — O99343 Other mental disorders complicating pregnancy, third trimester: Secondary | ICD-10-CM | POA: Insufficient documentation

## 2023-02-10 DIAGNOSIS — O26893 Other specified pregnancy related conditions, third trimester: Secondary | ICD-10-CM | POA: Insufficient documentation

## 2023-02-10 DIAGNOSIS — F419 Anxiety disorder, unspecified: Secondary | ICD-10-CM | POA: Insufficient documentation

## 2023-02-10 DIAGNOSIS — O24113 Pre-existing diabetes mellitus, type 2, in pregnancy, third trimester: Secondary | ICD-10-CM | POA: Insufficient documentation

## 2023-02-10 DIAGNOSIS — K649 Unspecified hemorrhoids: Secondary | ICD-10-CM

## 2023-02-10 DIAGNOSIS — O2243 Hemorrhoids in pregnancy, third trimester: Secondary | ICD-10-CM | POA: Insufficient documentation

## 2023-02-10 DIAGNOSIS — F32A Depression, unspecified: Secondary | ICD-10-CM | POA: Insufficient documentation

## 2023-02-10 NOTE — MAU Note (Signed)
.  Rhonda Cohen is a 27 y.o. at [redacted]w[redacted]d here in MAU reporting noticing yesterday she has hemorrhoids. Tonight about 2130 she noticed blood on her pants. Went to the bathroom and her girlfriend looked and noticed hemorrhoids were bleeding. Girlfriend mentioned one area looked dark colored. Called her ob and was told to come in. Reports good FM and denies VB or lOF  Onset of complaint: 2130 Pain score: 3 when still and 9 with movement Vitals:   02/10/23 2300 02/10/23 2302  BP:  133/79  Pulse: (!) 107   Resp: 18   Temp: 98 F (36.7 C)   SpO2: 99%      FHT:151 Lab orders placed from triage:

## 2023-02-11 ENCOUNTER — Encounter (HOSPITAL_COMMUNITY): Payer: Self-pay | Admitting: Obstetrics and Gynecology

## 2023-02-11 DIAGNOSIS — O26893 Other specified pregnancy related conditions, third trimester: Secondary | ICD-10-CM | POA: Diagnosis not present

## 2023-02-11 DIAGNOSIS — O99343 Other mental disorders complicating pregnancy, third trimester: Secondary | ICD-10-CM | POA: Diagnosis not present

## 2023-02-11 DIAGNOSIS — K649 Unspecified hemorrhoids: Secondary | ICD-10-CM | POA: Diagnosis not present

## 2023-02-11 DIAGNOSIS — O24113 Pre-existing diabetes mellitus, type 2, in pregnancy, third trimester: Secondary | ICD-10-CM | POA: Diagnosis not present

## 2023-02-11 DIAGNOSIS — F419 Anxiety disorder, unspecified: Secondary | ICD-10-CM | POA: Diagnosis not present

## 2023-02-11 DIAGNOSIS — Z3A31 31 weeks gestation of pregnancy: Secondary | ICD-10-CM

## 2023-02-11 DIAGNOSIS — F32A Depression, unspecified: Secondary | ICD-10-CM | POA: Diagnosis not present

## 2023-02-11 DIAGNOSIS — O2243 Hemorrhoids in pregnancy, third trimester: Secondary | ICD-10-CM | POA: Diagnosis present

## 2023-02-11 DIAGNOSIS — O99283 Endocrine, nutritional and metabolic diseases complicating pregnancy, third trimester: Secondary | ICD-10-CM | POA: Diagnosis not present

## 2023-02-11 MED ORDER — PHENYLEPHRINE IN HARD FAT 0.25 % RE SUPP
1.0000 | Freq: Two times a day (BID) | RECTAL | 0 refills | Status: AC
Start: 2023-02-11 — End: ?

## 2023-02-11 MED ORDER — POLYETHYLENE GLYCOL 3350 17 G PO PACK: 17.0000 g | PACK | Freq: Every day | ORAL | 0 refills | Status: DC

## 2023-02-11 MED ORDER — HYDROCORTISONE ACETATE 25 MG RE SUPP: 25.0000 mg | Freq: Two times a day (BID) | RECTAL | 0 refills | Status: DC

## 2023-02-11 MED ORDER — LIDOCAINE 4 % EX GEL: 1.0000 g | Freq: Three times a day (TID) | CUTANEOUS | 0 refills | Status: DC | PRN

## 2023-02-11 NOTE — Discharge Instructions (Signed)
Safe Medications in Pregnancy   Acne: Benzoyl Peroxide Salicylic Acid  Backache/Headache: Tylenol: 2 regular strength every 4 hours OR              2 Extra strength every 6 hours  Colds/Coughs/Allergies: Benadryl (alcohol free) 25 mg every 6 hours as needed Breath right strips Claritin Cepacol throat lozenges Chloraseptic throat spray Cold-Eeze- up to three times per day Cough drops, alcohol free Flonase (by prescription only) Guaifenesin Mucinex Robitussin DM (plain only, alcohol free) Saline nasal spray/drops Sudafed (pseudoephedrine) & Actifed ** use only after [redacted] weeks gestation and if you do not have high blood pressure Tylenol Vicks Vaporub Zinc lozenges Zyrtec   Constipation: Colace Ducolax suppositories Fleet enema Glycerin suppositories Metamucil Milk of magnesia Miralax Senokot Smooth move tea  Diarrhea: Kaopectate Imodium A-D  *NO pepto Bismol  Hemorrhoids: Anusol Anusol HC Preparation H Tucks  Indigestion: Tums Maalox Mylanta Zantac  Pepcid  Insomnia: Benadryl (alcohol free) 25mg  every 6 hours as needed Tylenol PM Unisom, no Gelcaps  Leg Cramps: Tums MagGel  Nausea/Vomiting:  Bonine Dramamine Emetrol Ginger extract Sea bands Meclizine  Nausea medication to take during pregnancy:  Unisom (doxylamine succinate 25 mg tablets) Take one tablet daily at bedtime. If symptoms are not adequately controlled, the dose can be increased to a maximum recommended dose of two tablets daily (1/2 tablet in the morning, 1/2 tablet mid-afternoon and one at bedtime). Vitamin B6 100mg  tablets. Take one tablet twice a day (up to 200 mg per day).  Skin Rashes: Aveeno products Benadryl cream or 25mg  every 6 hours as needed Calamine Lotion 1% cortisone cream  Yeast infection: Gyne-lotrimin 7 Monistat 7   **If taking multiple medications, please check labels to avoid duplicating the same active ingredients **take medication as directed on  the label ** Do not exceed 3000 mg of tylenol in 24 hours **Do not take medications that contain aspirin or ibuprofen

## 2023-02-11 NOTE — MAU Provider Note (Signed)
Chief Complaint: Hemorrhoids   Event Date/Time   First Provider Initiated Contact with Patient 02/10/23 2348      SUBJECTIVE HPI: Rhonda Cohen is a 27 y.o. G1P0 at [redacted]w[redacted]d by early ultrasound who presents to maternity admissions reporting bleeding hemorrhoid.  Patient notes hemorrhoids being irritated the past few days. Having one BM most days with some straining. Taking senna which has been helpful. Tonight hemorrhoid started to bleed. Had a clot that was wiped off and blood began to run down leg. Used pressure and was able to stop bleeding. Does not feel lightheaded or dizzy. Has tried tucks, dermaplast, anusual which have been helpful. Noting pain 3 at rest/9 with movement.  Denies VB, LOF, urinary symptoms. Has irregular, soft ctx at baseline. +FM.  HPI  Past Medical History:  Diagnosis Date   Anxiety    Depression    Hyperlipidemia    Musculoskeletal abnormal finding on examination    congenital bilateral wrist deformities   PCOS (polycystic ovarian syndrome)    Type 2 diabetes mellitus (HCC)    Past Surgical History:  Procedure Laterality Date   TONSILLECTOMY     Social History   Socioeconomic History   Marital status: Divorced    Spouse name: Not on file   Number of children: Not on file   Years of education: Not on file   Highest education level: Not on file  Occupational History   Not on file  Tobacco Use   Smoking status: Never   Smokeless tobacco: Current  Vaping Use   Vaping status: Former  Substance and Sexual Activity   Alcohol use: Not Currently   Drug use: Never   Sexual activity: Yes  Other Topics Concern   Not on file  Social History Narrative   Not on file   Social Determinants of Health   Financial Resource Strain: Low Risk  (06/22/2022)   Received from [facility]   Overall Financial Resource Strain (CARDIA)    Difficulty of Paying Living Expenses: Not hard at all  Food Insecurity: No Food Insecurity (06/22/2022)    Received from [facility]   Hunger Vital Sign    Worried About Running Out of Food in the Last Year: Never true    Ran Out of Food in the Last Year: Never true  Transportation Needs: No Transportation Needs (06/22/2022)   Received from [facility] - Transportation    In the past 12 months, has lack of transportation kept you from medical appointments or from getting medications?: No    Lack of Transportation (Non-Medical): No  Physical Activity: Not on file  Stress: Not on file  Social Connections: Unknown (11/29/2022)   Received [facility]   Social Network    Social Network: Not on file  Intimate Partner Violence: Not At Risk (11/29/2022)   Received from [facility]   Humiliation, Afraid, Rape, and Kick questionnaire    Fear of Current or Ex-Partner: No    Emotionally Abused: No    Physically Abused: No    Sexually Abused: No   No current facility-administered medications on file prior to encounter.   Current Outpatient Medications on File Prior to Encounter  Medication Sig Dispense Refill   metFORMIN (GLUMETZA) 1000 MG (MOD) 24 hr tablet Take 1,000 mg by mouth daily with breakfast.     Prenatal Vit-DSS-Fe Cbn-FA (PRENATAL AD PO) Take 1 tablet by mouth daily at 6 (six) AM.     ondansetron (  ZOFRAN-ODT) 4 MG disintegrating tablet Take 1 tablet (4 mg total) by mouth every 8 (eight) hours as needed. 8 tablet 0   Allergies  Allergen Reactions   Sulfa Antibiotics Other (See Comments)    ROS:  Pertinent positives/negatives listed above.  I have reviewed patient's Past Medical Hx, Surgical Hx, Family Hx, Social Hx, medications and allergies.   Physical Exam  Patient Vitals for the past 24 hrs:  BP Temp Pulse Resp SpO2 Height Weight  02/10/23 2351 -- -- -- -- 96 % -- --  02/10/23 2346 -- -- -- -- 97 % -- --  02/10/23 2341 -- -- -- -- 97 % -- --  02/10/23 2336 -- -- -- -- 96 % -- --  02/10/23 2334 118/73 -- (!) 104 17 -- -- --   02/10/23 2302 133/79 -- -- -- -- -- --  02/10/23 2300 -- 98 F (36.7 C) (!) 107 18 99 % 5' (1.524 m) 102.5 kg   Constitutional: Well-developed, well-nourished female in no acute distress.  Cardiovascular: normal rate Respiratory: normal effort GI: Abd soft, non-tender. Pos BS x 4 MS: Extremities nontender, no edema, normal ROM Neurologic: Alert and oriented x 4.  GU: Externally examined genitalia. No bleeding or irritation of urethral, vaginal. Multiple external hemorrhoids noted. Scabbing and dried blood noted on posterior hemorrhoid. No active bleeding. No thrombosis appreciated  FHT:  Baseline 130, moderate variability, accelerations present, no decelerations Contractions: irregular  LAB RESULTS No results found for this or any previous visit (from the past 24 hour(s)).    IMAGING No results found.  MAU Management/MDM: Orders Placed This Encounter  Procedures   Discharge patient    Meds ordered this encounter  Medications   polyethylene glycol (MIRALAX) 17 g packet    Sig: Take 17 g by mouth daily.    Dispense:  100 each    Refill:  0   phenylephrine (,USE FOR PREPARATION-H,) 0.25 % suppository    Sig: Place 1 suppository rectally 2 (two) times daily.    Dispense:  30 suppository    Refill:  0   hydrocortisone (ANUSOL-HC) 25 MG suppository    Sig: Place 1 suppository (25 mg total) rectally 2 (two) times daily.    Dispense:  24 suppository    Refill:  0   Lidocaine 4 % GEL    Sig: Apply 1 g topically 3 (three) times daily as needed (rectal pain). To rectum    Dispense:  113 g    Refill:  0    Patient found to have multiple large hemorrhoids. No active bleeding but scabbing noted. No thrombosed hemorrhoids found to require an I&D. As source of bleeding was found and no fetal distress or abdominal pain noted, did not perform pelvic exam. Additionally did not perform CBC as bleeding overall appears minimal, and patient showed no signs of instability. Discussed keeping  bowel movements very soft, so that she does not have to strain. Will add miralax to senna she is currently taking. Additionally provided with lidocaine gel, preparation H for symptom relief. Can continue to use tylenol, tucks pads, dermaplast as needed.  FWB: reassuring, reactive tracing  ASSESSMENT 1. Bleeding hemorrhoid   2. [redacted] weeks gestation of pregnancy     PLAN Discharge home with strict return precautions. Allergies as of 02/11/2023       Reactions   Sulfa Antibiotics Other (See Comments)        Medication List     TAKE these medications    FLUoxetine  20 MG capsule Commonly known as: PROZAC Take 1 capsule (20 mg total) by mouth daily.   hydrocortisone 25 MG suppository Commonly known as: ANUSOL-HC Place 1 suppository (25 mg total) rectally 2 (two) times daily.   insulin glargine 100 UNIT/ML injection Commonly known as: Lantus Inject 0.2 mLs (20 Units total) into the skin 2 (two) times daily.   Lidocaine 4 % Gel Apply 1 g topically 3 (three) times daily as needed (rectal pain). To rectum   metFORMIN 1000 MG (MOD) 24 hr tablet Commonly known as: GLUMETZA Take 1,000 mg by mouth daily with breakfast.   NIFEdipine 10 MG capsule Commonly known as: Procardia Take 1 capsule (10 mg total) by mouth 4 (four) times daily.   omeprazole 40 MG capsule Commonly known as: PRILOSEC Take 1 capsule (40 mg total) by mouth daily.   ondansetron 4 MG disintegrating tablet Commonly known as: ZOFRAN-ODT Take 1 tablet (4 mg total) by mouth every 8 (eight) hours as needed.   phenylephrine 0.25 % suppository Commonly known as: (USE for PREPARATION-H) Place 1 suppository rectally 2 (two) times daily.   polyethylene glycol 17 g packet Commonly known as: MiraLax Take 17 g by mouth daily.   PRENATAL AD PO Take 1 tablet by mouth daily at 6 (six) AM.         Wylene Simmer, MD OB Fellow 02/11/2023  1:00 AM

## 2023-02-20 DIAGNOSIS — Z3A32 32 weeks gestation of pregnancy: Secondary | ICD-10-CM | POA: Diagnosis not present

## 2023-02-20 DIAGNOSIS — O24113 Pre-existing diabetes mellitus, type 2, in pregnancy, third trimester: Secondary | ICD-10-CM | POA: Diagnosis not present

## 2023-02-22 DIAGNOSIS — O24113 Pre-existing diabetes mellitus, type 2, in pregnancy, third trimester: Secondary | ICD-10-CM | POA: Diagnosis not present

## 2023-02-22 DIAGNOSIS — Z3A32 32 weeks gestation of pregnancy: Secondary | ICD-10-CM | POA: Diagnosis not present

## 2023-02-27 DIAGNOSIS — Z3A33 33 weeks gestation of pregnancy: Secondary | ICD-10-CM | POA: Diagnosis not present

## 2023-02-27 DIAGNOSIS — O24113 Pre-existing diabetes mellitus, type 2, in pregnancy, third trimester: Secondary | ICD-10-CM | POA: Diagnosis not present

## 2023-03-02 DIAGNOSIS — O24113 Pre-existing diabetes mellitus, type 2, in pregnancy, third trimester: Secondary | ICD-10-CM | POA: Diagnosis not present

## 2023-03-09 DIAGNOSIS — O24113 Pre-existing diabetes mellitus, type 2, in pregnancy, third trimester: Secondary | ICD-10-CM | POA: Diagnosis not present

## 2023-03-09 DIAGNOSIS — Z3A35 35 weeks gestation of pregnancy: Secondary | ICD-10-CM | POA: Diagnosis not present

## 2023-03-12 ENCOUNTER — Encounter (HOSPITAL_COMMUNITY): Payer: Self-pay | Admitting: Obstetrics and Gynecology

## 2023-03-12 ENCOUNTER — Inpatient Hospital Stay (HOSPITAL_COMMUNITY)
Admission: AD | Admit: 2023-03-12 | Discharge: 2023-03-12 | Disposition: A | Discharge status: Home / Self Care | Payer: BC Managed Care – PPO | Attending: Obstetrics and Gynecology | Admitting: Obstetrics and Gynecology

## 2023-03-12 DIAGNOSIS — Z3A35 35 weeks gestation of pregnancy: Secondary | ICD-10-CM | POA: Diagnosis not present

## 2023-03-12 DIAGNOSIS — Z794 Long term (current) use of insulin: Secondary | ICD-10-CM | POA: Diagnosis not present

## 2023-03-12 DIAGNOSIS — Z0371 Encounter for suspected problem with amniotic cavity and membrane ruled out: Secondary | ICD-10-CM | POA: Insufficient documentation

## 2023-03-12 DIAGNOSIS — B3731 Acute candidiasis of vulva and vagina: Secondary | ICD-10-CM | POA: Diagnosis not present

## 2023-03-12 DIAGNOSIS — O24113 Pre-existing diabetes mellitus, type 2, in pregnancy, third trimester: Secondary | ICD-10-CM | POA: Insufficient documentation

## 2023-03-12 DIAGNOSIS — O98813 Other maternal infectious and parasitic diseases complicating pregnancy, third trimester: Secondary | ICD-10-CM | POA: Diagnosis present

## 2023-03-12 DIAGNOSIS — O4703 False labor before 37 completed weeks of gestation, third trimester: Secondary | ICD-10-CM | POA: Diagnosis present

## 2023-03-12 HISTORY — DX: Irritable bowel syndrome, unspecified: K58.9

## 2023-03-12 LAB — URINALYSIS, ROUTINE W REFLEX MICROSCOPIC
Bilirubin Urine: NEGATIVE
Glucose, UA: 50 mg/dL — AB
Hgb urine dipstick: NEGATIVE
Ketones, ur: 5 mg/dL — AB
Leukocytes,Ua: NEGATIVE
Nitrite: NEGATIVE
Protein, ur: 30 mg/dL — AB
Specific Gravity, Urine: 1.025 (ref 1.005–1.030)
pH: 5 (ref 5.0–8.0)

## 2023-03-12 LAB — RUPTURE OF MEMBRANE (ROM)PLUS: Rom Plus: NEGATIVE

## 2023-03-12 LAB — POCT FERN TEST: POCT Fern Test: NEGATIVE

## 2023-03-12 MED ORDER — ONDANSETRON 4 MG PO TBDP
8.0000 mg | ORAL_TABLET | Freq: Once | ORAL | Status: AC
  Administered 2023-03-12: 8 mg via ORAL
  Filled 2023-03-12: qty 2

## 2023-03-12 NOTE — MAU Provider Note (Signed)
History     CSN: [id number]  Arrival date and time: 03/12/23 1708   Event Date/Time   First Provider Initiated Contact with Patient 03/12/23 1847      Chief Complaint  Patient presents with   Contractions   Rupture of Membranes   Decreased Fetal Movement   Pelvic Pain   HPI [name] is a 27 y.o. year old G1P0 female at [redacted]w[redacted]d weeks gestation who presents to MAU reporting UC's every 4-7 mins. She reports the UC's have increased overnight; pain rated 6/10. She also reports trickle of clear fluid at 2345 and has been damp @ 2345. She is not currently wearing a pad. She also report on-going pelvic pain that is worsening; rated 10/10. She report FM, but less than usual. She states, "I am not sure if it's because the UC's are worse today and I'm not paying attention." She denies VB. She is currently using Monistat. She is Type 2 DM on insulin; takes Lantus 48 units in AM, Lantus 50 units in PM and Monolog 25 units with every meal. She receives [facility] with [facility]; next appt is 03/14/2023.   OB History     Gravida  1   Para      Term      Preterm      AB      Living         SAB      IAB      Ectopic      Multiple      Live Births              Past Medical History:  Diagnosis Date   Anxiety    Depression    Hyperlipidemia    Irritable bowel syndrome    Musculoskeletal abnormal finding on examination    congenital bilateral wrist deformities   PCOS (polycystic ovarian syndrome)    Type 2 diabetes mellitus (HCC)     Past Surgical History:  Procedure Laterality Date   TONSILLECTOMY      Family History  Problem Relation Age of Onset   Cancer Mother    Heart attack Father    Down syndrome Sister    Cancer Maternal Grandmother     Social History   Tobacco Use   Smoking status: Never   Smokeless tobacco: Current  Vaping Use   Vaping status: Former  Substance Use Topics   Alcohol use: Not Currently   Drug use: Never     Allergies:  Allergies  Allergen Reactions   Sulfa Antibiotics Other (See Comments)    No medications prior to admission.    Review of Systems  Constitutional: Negative.   HENT: Negative.    Eyes: Negative.   Respiratory: Negative.    Cardiovascular: Negative.   Gastrointestinal: Negative.   Endocrine: Negative.   Genitourinary:  Positive for pelvic pain and vaginal discharge.       DFM  Musculoskeletal: Negative.   Skin: Negative.   Allergic/Immunologic: Negative.   Neurological: Negative.   Hematological: Negative.   Psychiatric/Behavioral: Negative.     Physical Exam   Blood pressure 139/78, pulse 74, temperature 98.2 F (36.8 C), temperature source Oral, resp. rate 17, last menstrual period 06/21/2022.  Physical Exam Vitals and nursing note reviewed. Exam conducted with a chaperone present.  Constitutional:      Appearance: Normal appearance. She is obese.  Pulmonary:     Effort: Pulmonary effort is normal.  Abdominal:     Palpations: Abdomen  is soft.  Genitourinary:    General: Normal vulva.     Comments: Pelvic exam: External genitalia normal, SE: vaginal walls pink and well rugated, cervix is smooth, pink, no lesions, scant amt of thin, white vaginal d/c -- ROM Plus done, Uterus is non-tender, S=D, no CMT or friability, no adnexal tenderness.  Dilation: Closed (Ext Os= 1 cm, Int Os = closed) Effacement (%): Thick Cervical Position: Posterior Station: Ballotable Presentation: Undeterminable Exam by:: Carloyn Jaeger CNM  Musculoskeletal:        General: Normal range of motion.  Skin:    General: Skin is warm and dry.  Neurological:     Mental Status: She is alert and oriented to person, place, and time.  Psychiatric:        Mood and Affect: Mood normal.        Behavior: Behavior normal.        Thought Content: Thought content normal.        Judgment: Judgment normal.    REACTIVE NST - FHR: 135 bpm / moderate variability / accels present / decels  absent / TOCO: regular every 4-5 mins   MAU Course  Procedures  MDM [facility] Fern Slide ROM Plus x 2 (1st one lab unable to results due to specimen being "too thick")  Results for orders placed or performed during the hospital encounter of 03/12/23 (from the past 24 hour(s))  Urinalysis, Routine w reflex microscopic -Urine, Clean Catch     Status: Abnormal   Collection Time: 03/12/23  5:55 PM  Result Value Ref Range   Color, Urine AMBER (A) YELLOW   APPearance HAZY (A) CLEAR   Specific Gravity, Urine 1.025 1.005 - 1.030   pH 5.0 5.0 - 8.0   Glucose, UA 50 (A) NEGATIVE mg/dL   Hgb urine dipstick NEGATIVE NEGATIVE   Bilirubin Urine NEGATIVE NEGATIVE   Ketones, ur 5 (A) NEGATIVE mg/dL   Protein, ur 30 (A) NEGATIVE mg/dL   Nitrite NEGATIVE NEGATIVE   Leukocytes,Ua NEGATIVE NEGATIVE   RBC / HPF 0-5 0 - 5 RBC/hpf   WBC, UA 6-10 0 - 5 WBC/hpf   Bacteria, UA RARE (A) NONE SEEN   Squamous Epithelial / HPF 11-20 0 - 5 /HPF   Mucus PRESENT   Fern Test     Status: Normal   Collection Time: 03/12/23  6:00 PM  Result Value Ref Range   POCT Fern Test Negative = intact amniotic membranes   Rupture of Membrane (ROM) Plus     Status: None   Collection Time: 03/12/23  7:57 PM  Result Value Ref Range   Rom Plus NEGATIVE      Assessment and Plan  1. Preterm uterine contractions in third trimester, antepartum - Information provided on PTL and preventing PTB  - Discussed 2/3-1-1 Rule: Return to MAU for painful contractions every 2-3 minutes, lasting 1 minute each for 1.5 hours.  2. [redacted] weeks gestation of pregnancy   - Discharge patient - Keep scheduled appt with [facility] on Wednesday 03/14/2023 - Patient verbalized an understanding of the plan of care and agrees.   Raelyn Mora, CNM 03/12/2023, 6:47 PM

## 2023-03-12 NOTE — Discharge Instructions (Signed)
2/3-1-1 Rule Go to MAU for painful contractions every 2-3 minutes, lasting 1 minute each for 1.5 hours.  

## 2023-03-12 NOTE — MAU Note (Signed)
Repeat Rom+ collected and sent per lab request. JLS, RN

## 2023-03-12 NOTE — MAU Note (Signed)
Rhonda Cohen is a 27 y.o. at [redacted]w[redacted]d here in MAU reporting: ongoing ctxs that are 4-7 minutes that have worsened over night. States she noticed a trickle of fluid around 2345 last night. Pt states she has felt damp today but is not wearing a pad currently. Reports ongoing pelvic pain that has worsened. Reports less fetal movement than normal. Denies any VB.   Reports currently using Monistat due to yeast infection and has 3 days left with the suppository.   Currently on insulin for T2 diabetes 48unit lantus am, 50 lantus PM, 25 monolog short acting w/ every meal.  LMP: n/a Onset of complaint: 2345  11/17  Pain score: pelvic pain 10  ctx 6 Vitals:   03/12/23 1747  BP: (!) 130/96  Pulse: (!) 107     FHT:142 Lab orders placed from triage:  UA, Crist Fat

## 2023-03-14 DIAGNOSIS — O24113 Pre-existing diabetes mellitus, type 2, in pregnancy, third trimester: Secondary | ICD-10-CM | POA: Diagnosis not present

## 2023-03-14 DIAGNOSIS — Z3A35 35 weeks gestation of pregnancy: Secondary | ICD-10-CM | POA: Diagnosis not present

## 2023-03-23 ENCOUNTER — Encounter (HOSPITAL_COMMUNITY): Payer: Self-pay

## 2023-03-23 ENCOUNTER — Telehealth (HOSPITAL_COMMUNITY): Payer: Self-pay | Admitting: *Deleted

## 2023-03-23 NOTE — Telephone Encounter (Signed)
Preadmission screen  

## 2023-03-26 ENCOUNTER — Encounter (HOSPITAL_COMMUNITY)
Admission: RE | Admit: 2023-03-26 | Discharge: 2023-03-26 | Disposition: A | Discharge status: Home / Self Care | Payer: BC Managed Care – PPO | Source: Ambulatory Visit | Attending: Obstetrics and Gynecology | Admitting: Obstetrics and Gynecology

## 2023-03-26 ENCOUNTER — Other Ambulatory Visit: Payer: Self-pay | Admitting: Obstetrics and Gynecology

## 2023-03-26 ENCOUNTER — Encounter (HOSPITAL_COMMUNITY): Payer: Self-pay

## 2023-03-26 ENCOUNTER — Encounter (HOSPITAL_COMMUNITY): Payer: Self-pay | Admitting: *Deleted

## 2023-03-26 ENCOUNTER — Telehealth (HOSPITAL_COMMUNITY): Payer: Self-pay | Admitting: *Deleted

## 2023-03-26 DIAGNOSIS — Z01812 Encounter for preprocedural laboratory examination: Secondary | ICD-10-CM | POA: Insufficient documentation

## 2023-03-26 DIAGNOSIS — O24113 Pre-existing diabetes mellitus, type 2, in pregnancy, third trimester: Secondary | ICD-10-CM | POA: Insufficient documentation

## 2023-03-26 DIAGNOSIS — Z01818 Encounter for other preprocedural examination: Secondary | ICD-10-CM | POA: Diagnosis present

## 2023-03-26 DIAGNOSIS — Z3A37 37 weeks gestation of pregnancy: Secondary | ICD-10-CM | POA: Insufficient documentation

## 2023-03-26 LAB — TYPE AND SCREEN
ABO/RH(D): O POS
Antibody Screen: NEGATIVE

## 2023-03-26 LAB — CBC
HCT: 36.2 % (ref 36.0–46.0)
Hemoglobin: 11.6 g/dL — ABNORMAL LOW (ref 12.0–15.0)
MCH: 25.3 pg — ABNORMAL LOW (ref 26.0–34.0)
MCHC: 32 g/dL (ref 30.0–36.0)
MCV: 78.9 fL — ABNORMAL LOW (ref 80.0–100.0)
Platelets: 316 10*3/uL (ref 150–400)
RBC: 4.59 MIL/uL (ref 3.87–5.11)
RDW: 15.3 % (ref 11.5–15.5)
WBC: 12.7 10*3/uL — ABNORMAL HIGH (ref 4.0–10.5)
nRBC: 0 % (ref 0.0–0.2)

## 2023-03-26 NOTE — Telephone Encounter (Signed)
Preadmission screen  

## 2023-03-26 NOTE — Patient Instructions (Signed)
Rhonda Cohen  03/26/2023   Your procedure is scheduled on:  03/29/2023  Arrive at 0800 at Entrance C on [address] at [facility]  and [employer]. You are invited to use the FREE valet parking or use the Visitor's parking deck.  Pick up the phone at the desk and dial [phone].  Call this number if you have problems the morning of surgery: [phone]  Remember:   Do not eat food:(After Midnight) Desps de medianoche.  You may drink clear liquids until arrival at _0800____.  Clear liquids means a liquid you can see thru.  It can have color such as Cola or Kool aid.  Tea is OK and coffee as long as no milk or creamer of any kind.You may drink clear liquids until arrival at __0800___.  Clear liquids means a liquid you can see thru.  It can have color such as Cola or Kool aid.  Tea is OK and coffee as long as no milk or creamer of any kind.  Take these medicines the morning of surgery with A SIP OF WATER:  The night before surgery take half of the prescribed insulin dose.  No insulin or medications on day of surgery   Do not wear jewelry, make-up or nail polish.  Do not wear lotions, powders, or perfumes. Do not wear deodorant.  Do not shave 48 hours prior to surgery.  Do not bring valuables to the hospital.  [facility] is not   responsible for any belongings or valuables brought to the hospital.  Contacts, dentures or bridgework may not be worn into surgery.  Leave suitcase in the car. After surgery it may be brought to your room.  For patients admitted to the hospital, checkout time is 11:00 AM the day of              discharge.      Please read over the following fact sheets that you were given:     Preparing for Surgery

## 2023-03-27 ENCOUNTER — Inpatient Hospital Stay (HOSPITAL_COMMUNITY)
Admission: RE | Admit: 2023-03-27 | Discharge: 2023-03-27 | Disposition: A | Discharge status: Home / Self Care | Payer: PRIVATE HEALTH INSURANCE | Source: Ambulatory Visit

## 2023-03-27 ENCOUNTER — Other Ambulatory Visit (HOSPITAL_COMMUNITY): Payer: PRIVATE HEALTH INSURANCE

## 2023-03-27 LAB — RPR: RPR Ser Ql: NONREACTIVE

## 2023-03-29 ENCOUNTER — Inpatient Hospital Stay (HOSPITAL_COMMUNITY): Payer: Commercial Managed Care - PPO

## 2023-03-29 ENCOUNTER — Other Ambulatory Visit: Payer: Self-pay

## 2023-03-29 ENCOUNTER — Inpatient Hospital Stay (HOSPITAL_COMMUNITY)
Admission: RE | Admit: 2023-03-29 | Payer: Commercial Managed Care - PPO | Source: Home / Self Care | Admitting: Obstetrics and Gynecology

## 2023-03-29 ENCOUNTER — Inpatient Hospital Stay (HOSPITAL_COMMUNITY)
Admission: AD | Admit: 2023-03-29 | Discharge: 2023-04-01 | DRG: 786 | Disposition: A | Discharge status: Home / Self Care | Payer: BC Managed Care – PPO | Attending: Obstetrics and Gynecology | Admitting: Obstetrics and Gynecology

## 2023-03-29 ENCOUNTER — Inpatient Hospital Stay (HOSPITAL_COMMUNITY): Payer: BC Managed Care – PPO | Admitting: Anesthesiology

## 2023-03-29 ENCOUNTER — Encounter (HOSPITAL_COMMUNITY)
Admission: AD | Disposition: A | Discharge status: Home / Self Care | Payer: Self-pay | Source: Home / Self Care | Attending: Obstetrics and Gynecology

## 2023-03-29 ENCOUNTER — Encounter (HOSPITAL_COMMUNITY): Payer: Self-pay | Admitting: Student

## 2023-03-29 DIAGNOSIS — E119 Type 2 diabetes mellitus without complications: Secondary | ICD-10-CM | POA: Diagnosis present

## 2023-03-29 DIAGNOSIS — Z8249 Family history of ischemic heart disease and other diseases of the circulatory system: Secondary | ICD-10-CM | POA: Diagnosis not present

## 2023-03-29 DIAGNOSIS — F419 Anxiety disorder, unspecified: Secondary | ICD-10-CM | POA: Diagnosis present

## 2023-03-29 DIAGNOSIS — E785 Hyperlipidemia, unspecified: Secondary | ICD-10-CM | POA: Diagnosis present

## 2023-03-29 DIAGNOSIS — O2412 Pre-existing diabetes mellitus, type 2, in childbirth: Secondary | ICD-10-CM | POA: Diagnosis present

## 2023-03-29 DIAGNOSIS — Z3A37 37 weeks gestation of pregnancy: Secondary | ICD-10-CM

## 2023-03-29 DIAGNOSIS — O99214 Obesity complicating childbirth: Secondary | ICD-10-CM | POA: Diagnosis present

## 2023-03-29 DIAGNOSIS — Z794 Long term (current) use of insulin: Secondary | ICD-10-CM

## 2023-03-29 DIAGNOSIS — Z8279 Family history of other congenital malformations, deformations and chromosomal abnormalities: Secondary | ICD-10-CM | POA: Diagnosis not present

## 2023-03-29 DIAGNOSIS — Z7984 Long term (current) use of oral hypoglycemic drugs: Secondary | ICD-10-CM

## 2023-03-29 DIAGNOSIS — Z3A30 30 weeks gestation of pregnancy: Secondary | ICD-10-CM | POA: Diagnosis not present

## 2023-03-29 DIAGNOSIS — O134 Gestational [pregnancy-induced] hypertension without significant proteinuria, complicating childbirth: Secondary | ICD-10-CM | POA: Diagnosis not present

## 2023-03-29 DIAGNOSIS — O3660X0 Maternal care for excessive fetal growth, unspecified trimester, not applicable or unspecified: Secondary | ICD-10-CM

## 2023-03-29 DIAGNOSIS — O99344 Other mental disorders complicating childbirth: Secondary | ICD-10-CM | POA: Diagnosis not present

## 2023-03-29 DIAGNOSIS — F32A Depression, unspecified: Secondary | ICD-10-CM | POA: Diagnosis not present

## 2023-03-29 DIAGNOSIS — O3663X0 Maternal care for excessive fetal growth, third trimester, not applicable or unspecified: Secondary | ICD-10-CM | POA: Diagnosis present

## 2023-03-29 DIAGNOSIS — O4292 Full-term premature rupture of membranes, unspecified as to length of time between rupture and onset of labor: Secondary | ICD-10-CM | POA: Diagnosis not present

## 2023-03-29 DIAGNOSIS — O24113 Pre-existing diabetes mellitus, type 2, in pregnancy, third trimester: Secondary | ICD-10-CM | POA: Diagnosis present

## 2023-03-29 HISTORY — DX: Maternal care for excessive fetal growth, unspecified trimester, not applicable or unspecified: O36.60X0

## 2023-03-29 LAB — URINALYSIS, ROUTINE W REFLEX MICROSCOPIC
Bilirubin Urine: NEGATIVE
Glucose, UA: NEGATIVE mg/dL
Ketones, ur: NEGATIVE mg/dL
Nitrite: NEGATIVE
Protein, ur: 100 mg/dL — AB
RBC / HPF: >50 RBC/hpf (ref 0–5)
Specific Gravity, Urine: 1.012 (ref 1.005–1.030)
pH: 8 (ref 5.0–8.0)

## 2023-03-29 LAB — COMPREHENSIVE METABOLIC PANEL
ALT: 11 U/L (ref 0–44)
AST: 16 U/L (ref 15–41)
Albumin: 2 g/dL — ABNORMAL LOW (ref 3.5–5.0)
Alkaline Phosphatase: 91 U/L (ref 38–126)
Anion gap: 11 (ref 5–15)
BUN: 9 mg/dL (ref 6–20)
CO2: 19 mmol/L — ABNORMAL LOW (ref 22–32)
Calcium: 9.3 mg/dL (ref 8.9–10.3)
Chloride: 104 mmol/L (ref 98–111)
Creatinine, Ser: 0.8 mg/dL (ref 0.44–1.00)
GFR, Estimated: >60 mL/min (ref >60)
Glucose, Bld: 128 mg/dL — ABNORMAL HIGH (ref 70–99)
Potassium: 4.2 mmol/L (ref 3.5–5.1)
Sodium: 134 mmol/L — ABNORMAL LOW (ref 135–145)
Total Bilirubin: 0.6 mg/dL (ref <1.2)
Total Protein: 6.1 g/dL — ABNORMAL LOW (ref 6.5–8.1)

## 2023-03-29 LAB — PROTEIN / CREATININE RATIO, URINE
Creatinine, Urine: 49 mg/dL
Protein Creatinine Ratio: 16.14 mg/mg{creat} — ABNORMAL HIGH (ref 0.00–0.15)
Total Protein, Urine: 791 mg/dL

## 2023-03-29 LAB — CBC
HCT: 37.1 % (ref 36.0–46.0)
Hemoglobin: 12 g/dL (ref 12.0–15.0)
MCH: 25.8 pg — ABNORMAL LOW (ref 26.0–34.0)
MCHC: 32.3 g/dL (ref 30.0–36.0)
MCV: 79.8 fL — ABNORMAL LOW (ref 80.0–100.0)
Platelets: 296 10*3/uL (ref 150–400)
RBC: 4.65 MIL/uL (ref 3.87–5.11)
RDW: 15.5 % (ref 11.5–15.5)
WBC: 15.3 10*3/uL — ABNORMAL HIGH (ref 4.0–10.5)
nRBC: 0.1 % (ref 0.0–0.2)

## 2023-03-29 LAB — TYPE AND SCREEN
ABO/RH(D): O POS
Antibody Screen: NEGATIVE

## 2023-03-29 LAB — GLUCOSE, CAPILLARY
Glucose-Capillary: 133 mg/dL — ABNORMAL HIGH (ref 70–99)
Glucose-Capillary: 165 mg/dL — ABNORMAL HIGH (ref 70–99)
Glucose-Capillary: 193 mg/dL — ABNORMAL HIGH (ref 70–99)
Glucose-Capillary: 175 mg/dL — ABNORMAL HIGH (ref 70–99)
Glucose-Capillary: 112 mg/dL — ABNORMAL HIGH (ref 70–99)

## 2023-03-29 LAB — POCT FERN TEST: POCT Fern Test: POSITIVE

## 2023-03-29 SURGERY — Surgical Case
Anesthesia: Spinal

## 2023-03-29 MED ORDER — LABETALOL HCL 5 MG/ML IV SOLN
20.0000 mg | INTRAVENOUS | Status: DC | PRN
  Administered 2023-03-29: 20 mg via INTRAVENOUS
  Filled 2023-03-29: qty 4

## 2023-03-29 MED ORDER — OXYCODONE HCL 5 MG PO TABS: 5.0000 mg | ORAL_TABLET | ORAL | Status: DC | PRN

## 2023-03-29 MED ORDER — ACETAMINOPHEN 10 MG/ML IV SOLN
INTRAVENOUS | Status: AC
  Filled 2023-03-29: qty 100

## 2023-03-29 MED ORDER — EPHEDRINE SULFATE (PRESSORS) 50 MG/ML IJ SOLN
INTRAMUSCULAR | Status: DC | PRN
Start: 2023-03-29 — End: 2023-03-29
  Administered 2023-03-29 (×2): 5 mg via INTRAVENOUS

## 2023-03-29 MED ORDER — DEXAMETHASONE SODIUM PHOSPHATE 10 MG/ML IJ SOLN
INTRAMUSCULAR | Status: DC | PRN
  Administered 2023-03-29: 10 mg via INTRAVENOUS

## 2023-03-29 MED ORDER — CEFAZOLIN SODIUM-DEXTROSE 2-3 GM-%(50ML) IV SOLR
INTRAVENOUS | Status: DC | PRN
  Administered 2023-03-29: 2 g via INTRAVENOUS

## 2023-03-29 MED ORDER — LABETALOL HCL 5 MG/ML IV SOLN: 80.0000 mg | INTRAVENOUS | Status: DC | PRN

## 2023-03-29 MED ORDER — DEXAMETHASONE SODIUM PHOSPHATE 10 MG/ML IJ SOLN
INTRAMUSCULAR | Status: AC
  Filled 2023-03-29: qty 1

## 2023-03-29 MED ORDER — ALBUMIN HUMAN 5 % IV SOLN: INTRAVENOUS | Status: DC | PRN

## 2023-03-29 MED ORDER — PRENATAL MULTIVITAMIN CH
1.0000 | ORAL_TABLET | Freq: Every day | ORAL | Status: DC
  Administered 2023-03-29 – 2023-04-01 (×4): 1 via ORAL
  Filled 2023-03-29 (×4): qty 1

## 2023-03-29 MED ORDER — PHENYLEPHRINE 80 MCG/ML (10ML) SYRINGE FOR IV PUSH (FOR BLOOD PRESSURE SUPPORT)
PREFILLED_SYRINGE | INTRAVENOUS | Status: AC
  Filled 2023-03-29: qty 10

## 2023-03-29 MED ORDER — METOCLOPRAMIDE HCL 5 MG/ML IJ SOLN
INTRAMUSCULAR | Status: AC
  Filled 2023-03-29: qty 2

## 2023-03-29 MED ORDER — NIFEDIPINE ER OSMOTIC RELEASE 30 MG PO TB24
30.0000 mg | ORAL_TABLET | Freq: Every day | ORAL | Status: DC
  Administered 2023-03-29 – 2023-03-30 (×2): 30 mg via ORAL
  Filled 2023-03-29 (×2): qty 1

## 2023-03-29 MED ORDER — INSULIN ASPART 100 UNIT/ML IJ SOLN
0.0000 [IU] | Freq: Three times a day (TID) | INTRAMUSCULAR | Status: DC
  Administered 2023-03-29 (×2): 2 [IU] via SUBCUTANEOUS

## 2023-03-29 MED ORDER — LABETALOL HCL 5 MG/ML IV SOLN: 40.0000 mg | INTRAVENOUS | Status: DC | PRN

## 2023-03-29 MED ORDER — FENTANYL CITRATE (PF) 100 MCG/2ML IJ SOLN
INTRAMUSCULAR | Status: AC
  Filled 2023-03-29: qty 2

## 2023-03-29 MED ORDER — OXYTOCIN-SODIUM CHLORIDE 30-0.9 UT/500ML-% IV SOLN: 2.5000 [IU]/h | INTRAVENOUS | Status: AC

## 2023-03-29 MED ORDER — OXYCODONE HCL 5 MG/5ML PO SOLN: 5.0000 mg | Freq: Once | ORAL | Status: DC | PRN

## 2023-03-29 MED ORDER — COCONUT OIL OIL
1.0000 | TOPICAL_OIL | Status: DC | PRN
  Administered 2023-03-30: 1 via TOPICAL

## 2023-03-29 MED ORDER — WITCH HAZEL-GLYCERIN EX PADS: 1.0000 | MEDICATED_PAD | CUTANEOUS | Status: DC | PRN

## 2023-03-29 MED ORDER — ONDANSETRON HCL 4 MG/2ML IJ SOLN
INTRAMUSCULAR | Status: AC
  Filled 2023-03-29: qty 2

## 2023-03-29 MED ORDER — SIMETHICONE 80 MG PO CHEW: 80.0000 mg | CHEWABLE_TABLET | ORAL | Status: DC | PRN

## 2023-03-29 MED ORDER — IBUPROFEN 600 MG PO TABS
600.0000 mg | ORAL_TABLET | Freq: Four times a day (QID) | ORAL | Status: DC
Start: 2023-03-30 — End: 2023-04-01
  Administered 2023-03-30 – 2023-04-01 (×10): 600 mg via ORAL
  Filled 2023-03-29 (×10): qty 1

## 2023-03-29 MED ORDER — DIPHENHYDRAMINE HCL 25 MG PO CAPS: 25.0000 mg | ORAL_CAPSULE | Freq: Four times a day (QID) | ORAL | Status: DC | PRN

## 2023-03-29 MED ORDER — LACTATED RINGERS IV BOLUS
1000.0000 mL | Freq: Once | INTRAVENOUS | Status: AC
  Administered 2023-03-29: 1000 mL via INTRAVENOUS

## 2023-03-29 MED ORDER — FENTANYL CITRATE (PF) 100 MCG/2ML IJ SOLN: 25.0000 ug | INTRAMUSCULAR | Status: DC | PRN

## 2023-03-29 MED ORDER — METFORMIN HCL 500 MG PO TABS
1000.0000 mg | ORAL_TABLET | Freq: Every day | ORAL | Status: DC
  Administered 2023-03-29 – 2023-03-31 (×3): 1000 mg via ORAL
  Filled 2023-03-29 (×3): qty 2

## 2023-03-29 MED ORDER — HYDRALAZINE HCL 20 MG/ML IJ SOLN: 10.0000 mg | INTRAMUSCULAR | Status: DC | PRN

## 2023-03-29 MED ORDER — ONDANSETRON HCL 4 MG/2ML IJ SOLN
INTRAMUSCULAR | Status: DC | PRN
  Administered 2023-03-29: 4 mg via INTRAVENOUS

## 2023-03-29 MED ORDER — SODIUM CHLORIDE 0.9 % IV SOLN: INTRAVENOUS | Status: DC | PRN

## 2023-03-29 MED ORDER — SODIUM CHLORIDE 0.9 % IV SOLN
500.0000 mg | INTRAVENOUS | Status: AC
  Administered 2023-03-29: 500 mg via INTRAVENOUS

## 2023-03-29 MED ORDER — SENNOSIDES-DOCUSATE SODIUM 8.6-50 MG PO TABS
2.0000 | ORAL_TABLET | Freq: Every day | ORAL | Status: DC
  Administered 2023-03-30: 2 via ORAL
  Filled 2023-03-29 (×3): qty 2

## 2023-03-29 MED ORDER — SIMETHICONE 80 MG PO CHEW
80.0000 mg | CHEWABLE_TABLET | Freq: Three times a day (TID) | ORAL | Status: DC
  Administered 2023-03-29 – 2023-04-01 (×7): 80 mg via ORAL
  Filled 2023-03-29 (×6): qty 1

## 2023-03-29 MED ORDER — MORPHINE SULFATE (PF) 0.5 MG/ML IJ SOLN
INTRAMUSCULAR | Status: AC
  Filled 2023-03-29: qty 10

## 2023-03-29 MED ORDER — ENOXAPARIN SODIUM 60 MG/0.6ML IJ SOSY
60.0000 mg | PREFILLED_SYRINGE | Freq: Two times a day (BID) | INTRAMUSCULAR | Status: DC
  Administered 2023-03-29 – 2023-04-01 (×7): 60 mg via SUBCUTANEOUS
  Filled 2023-03-29 (×6): qty 0.6

## 2023-03-29 MED ORDER — PHENYLEPHRINE HCL-NACL 20-0.9 MG/250ML-% IV SOLN
INTRAVENOUS | Status: DC | PRN
  Administered 2023-03-29: 30 ug/min via INTRAVENOUS

## 2023-03-29 MED ORDER — ACETAMINOPHEN 10 MG/ML IV SOLN
INTRAVENOUS | Status: DC | PRN
  Administered 2023-03-29: 1000 mg via INTRAVENOUS

## 2023-03-29 MED ORDER — DIBUCAINE (PERIANAL) 1 % EX OINT: 1.0000 | TOPICAL_OINTMENT | CUTANEOUS | Status: DC | PRN

## 2023-03-29 MED ORDER — EPHEDRINE 5 MG/ML INJ
INTRAVENOUS | Status: AC
  Filled 2023-03-29: qty 5

## 2023-03-29 MED ORDER — ALBUMIN HUMAN 5 % IV SOLN
INTRAVENOUS | Status: AC
  Filled 2023-03-29: qty 250

## 2023-03-29 MED ORDER — SOD CITRATE-CITRIC ACID 500-334 MG/5ML PO SOLN
30.0000 mL | Freq: Once | ORAL | Status: AC
Start: 2023-03-29 — End: 2023-03-29
  Administered 2023-03-29: 30 mL via ORAL
  Filled 2023-03-29: qty 30

## 2023-03-29 MED ORDER — OXYTOCIN-SODIUM CHLORIDE 30-0.9 UT/500ML-% IV SOLN
INTRAVENOUS | Status: AC
  Filled 2023-03-29: qty 500

## 2023-03-29 MED ORDER — LACTATED RINGERS IV SOLN: INTRAVENOUS | Status: DC

## 2023-03-29 MED ORDER — OXYCODONE HCL 5 MG PO TABS: 5.0000 mg | ORAL_TABLET | Freq: Once | ORAL | Status: DC | PRN

## 2023-03-29 MED ORDER — OXYCODONE-ACETAMINOPHEN 5-325 MG PO TABS
2.0000 | ORAL_TABLET | ORAL | Status: DC | PRN
Start: 2023-03-29 — End: 2023-04-01

## 2023-03-29 MED ORDER — TRANEXAMIC ACID-NACL 1000-0.7 MG/100ML-% IV SOLN
INTRAVENOUS | Status: AC
  Filled 2023-03-29: qty 100

## 2023-03-29 MED ORDER — KETOROLAC TROMETHAMINE 30 MG/ML IJ SOLN
30.0000 mg | Freq: Four times a day (QID) | INTRAMUSCULAR | Status: AC
  Administered 2023-03-29 (×4): 30 mg via INTRAVENOUS
  Filled 2023-03-29 (×4): qty 1

## 2023-03-29 MED ORDER — INSULIN GLARGINE-YFGN 100 UNIT/ML ~~LOC~~ SOLN
20.0000 [IU] | Freq: Two times a day (BID) | SUBCUTANEOUS | Status: DC
  Administered 2023-03-29 (×2): 20 [IU] via SUBCUTANEOUS
  Filled 2023-03-29 (×4): qty 0.2

## 2023-03-29 MED ORDER — OXYTOCIN-SODIUM CHLORIDE 30-0.9 UT/500ML-% IV SOLN
INTRAVENOUS | Status: DC | PRN
  Administered 2023-03-29: 30 [IU] via INTRAVENOUS

## 2023-03-29 MED ORDER — ONDANSETRON HCL 4 MG/2ML IJ SOLN: 4.0000 mg | Freq: Once | INTRAMUSCULAR | Status: DC | PRN

## 2023-03-29 MED ORDER — MENTHOL 3 MG MT LOZG: 1.0000 | LOZENGE | OROMUCOSAL | Status: DC | PRN

## 2023-03-29 MED ORDER — TRANEXAMIC ACID-NACL 1000-0.7 MG/100ML-% IV SOLN
INTRAVENOUS | Status: DC | PRN
  Administered 2023-03-29: 1000 mg via INTRAVENOUS

## 2023-03-29 MED ORDER — ZOLPIDEM TARTRATE 5 MG PO TABS: 5.0000 mg | ORAL_TABLET | Freq: Every evening | ORAL | Status: DC | PRN

## 2023-03-29 MED ORDER — METOCLOPRAMIDE HCL 5 MG/ML IJ SOLN
INTRAMUSCULAR | Status: DC | PRN
  Administered 2023-03-29: 10 mg via INTRAVENOUS

## 2023-03-29 MED ORDER — MEPERIDINE HCL 25 MG/ML IJ SOLN: 6.2500 mg | INTRAMUSCULAR | Status: DC | PRN

## 2023-03-29 MED ORDER — FLUOXETINE HCL 20 MG PO CAPS
20.0000 mg | ORAL_CAPSULE | Freq: Every day | ORAL | Status: DC
Start: 2023-03-29 — End: 2023-04-01
  Administered 2023-03-29 – 2023-03-31 (×3): 20 mg via ORAL
  Filled 2023-03-29 (×3): qty 1

## 2023-03-29 MED ORDER — ACETAMINOPHEN 500 MG PO TABS
1000.0000 mg | ORAL_TABLET | Freq: Four times a day (QID) | ORAL | Status: DC
  Administered 2023-03-29 – 2023-04-01 (×13): 1000 mg via ORAL
  Filled 2023-03-29 (×13): qty 2

## 2023-03-29 MED ORDER — BUPIVACAINE IN DEXTROSE 0.75-8.25 % IT SOLN
INTRATHECAL | Status: DC | PRN
  Administered 2023-03-29: 1.5 mL via INTRATHECAL

## 2023-03-29 MED ORDER — ENOXAPARIN SODIUM 40 MG/0.4ML IJ SOSY: 40.0000 mg | PREFILLED_SYRINGE | Freq: Two times a day (BID) | INTRAMUSCULAR | Status: DC

## 2023-03-29 SURGICAL SUPPLY — 37 items
BENZOIN TINCTURE PRP APPL 2/3 (GAUZE/BANDAGES/DRESSINGS) IMPLANT
CHLORAPREP W/TINT 26 (MISCELLANEOUS) ×2 IMPLANT
CLAMP UMBILICAL CORD (MISCELLANEOUS) ×1 IMPLANT
CLOTH BEACON ORANGE TIMEOUT ST (SAFETY) ×1 IMPLANT
DERMABOND ADVANCED .7 DNX12 (GAUZE/BANDAGES/DRESSINGS) IMPLANT
DRESSING PREVENA PLUS CUSTOM (GAUZE/BANDAGES/DRESSINGS) IMPLANT
DRSG OPSITE POSTOP 4X10 (GAUZE/BANDAGES/DRESSINGS) ×1 IMPLANT
DRSG PREVENA PLUS CUSTOM (GAUZE/BANDAGES/DRESSINGS) ×1
ELECT REM PT RETURN 9FT ADLT (ELECTROSURGICAL) ×1
ELECTRODE REM PT RTRN 9FT ADLT (ELECTROSURGICAL) ×1 IMPLANT
EXTRACTOR VACUUM KIWI (MISCELLANEOUS) IMPLANT
GLOVE BIO SURGEON STRL SZ7 (GLOVE) ×1 IMPLANT
GLOVE BIOGEL PI IND STRL 7.0 (GLOVE) ×1 IMPLANT
GOWN STRL REUS W/TWL LRG LVL3 (GOWN DISPOSABLE) ×2 IMPLANT
KIT ABG SYR 3ML LUER SLIP (SYRINGE) IMPLANT
NDL HYPO 25X5/8 SAFETYGLIDE (NEEDLE) IMPLANT
NEEDLE HYPO 22GX1.5 SAFETY (NEEDLE) IMPLANT
NEEDLE HYPO 25X5/8 SAFETYGLIDE (NEEDLE) IMPLANT
NS IRRIG 1000ML POUR BTL (IV SOLUTION) ×1 IMPLANT
PACK C SECTION WH (CUSTOM PROCEDURE TRAY) ×1 IMPLANT
PAD OB MATERNITY 4.3X12.25 (PERSONAL CARE ITEMS) ×1 IMPLANT
RETRACTOR TRAXI PANNICULUS (MISCELLANEOUS) IMPLANT
RETRACTOR WND ALEXIS 25 LRG (MISCELLANEOUS) IMPLANT
RTRCTR C-SECT PINK 25CM LRG (MISCELLANEOUS) ×1 IMPLANT
RTRCTR WOUND ALEXIS 25CM LRG (MISCELLANEOUS) ×1
STRIP CLOSURE SKIN 1/2X4 (GAUZE/BANDAGES/DRESSINGS) IMPLANT
SUT CHROMIC 1 CTX 36 (SUTURE) ×2 IMPLANT
SUT CHROMIC 2 0 CT 1 (SUTURE) ×1 IMPLANT
SUT PDS AB 0 CTX 36 PDP370T (SUTURE) IMPLANT
SUT PDS AB 0 CTX 60 (SUTURE) ×1 IMPLANT
SUT VIC AB 2-0 CT1 TAPERPNT 27 (SUTURE) ×1 IMPLANT
SUT VIC AB 4-0 KS 27 (SUTURE) IMPLANT
SUTURE PLAIN GUT 2.0 ETHICON (SUTURE) IMPLANT
SYR 30ML LL (SYRINGE) IMPLANT
TOWEL OR 17X24 6PK STRL BLUE (TOWEL DISPOSABLE) ×1 IMPLANT
TRAY FOLEY W/BAG SLVR 14FR LF (SET/KITS/TRAYS/PACK) ×1 IMPLANT
WATER STERILE IRR 1000ML POUR (IV SOLUTION) ×1 IMPLANT

## 2023-03-29 NOTE — Op Note (Signed)
C-Section Operative Note  Date: 03/29/23  Preoperative Diagnosis: [redacted] weeks gestation, prelabor rupture of membranes, suspected macrosomia, type II diabetes in pregnancy, category II fetal heart tracing, maternal obesity in pregnancy Postoperative Diagnosis: [redacted] weeks gestation, prelabor rupture of membranes, fetal macrosomia, type II diabetes in pregnancy Surgeon: Truett Perna, DO Assistant: Pryor Ochoa, DO  An experienced assistant was required given the standard of surgical care given the complexity of the case and maternal body habitus.  This assistant was needed for exposure, dissection, suctioning, retraction, instrument exchange, assisting with delivery with administration of fundal pressure, and for overall help during the procedure.     Operative Findings: Gravid uterus. Thin lower uterine segment. Clear amniotic fluid. Viable female infant in cephalic position with compound left hand weighing 5400g with APGARS of 7 and 9 at 1 and 5 minutes, respectively. Normal fallopian tubes and ovaries bilaterally. Non-expanding hematoma along left hysterotomy edge. Foley draining clear urine Specimens: Placenta to L&D EBL 1074 IVF 1250 UOP 25  Patient Course: 27 yo G1P0 @ 37.6 admitted with PROM. Pregnancy is complicated by suspected macrosomia in the setting of type 2 diabetes.  Consent:  R/B/A of cesarean section discussed with patient. Alternative would be vaginal delivery which would mean shorter postpartum stay and decreased risk of bleeding. Risks of cesarean section include but are not limited to infection of the uterus, pelvic organs, or skin, inadvertent injury to internal organs, such as bowel or bladder, vasculature, and nerves. If there is major injury, extensive surgery may be required. If injury is minor, it may be treated with relative ease and repaired at the time of injury. Discussed possibility of excessive blood loss and transfusion. If bleeding cannot be controlled using medical  or minor surgical methods, a cesarean hysterectomy may be performed which would mean no future fertility. Patient accepts the possibility of blood transfusion, if necessary. Patient understands and agrees to move forward with section.   Operative Procedure: Patient was taken to the operating room where spinal anesthesia was found to be adequate by Allis clamp test. A Traxi panniculus retractor was used to displace the abdomen superiorly. She was prepped and draped in the normal sterile fashion in the dorsal supine position with a leftward tilt. An appropriate time out was performed. Tranexamic acid was administered prophylactically to decrease bleeding. A Pfannenstiel skin incision was then made with the scalpel and carried through to the underlying layer of fascia by sharp dissection and Bovie cautery. The fascia was nicked in the midline and the incision was extended laterally with Mayo scissors. The superior aspect of the incision was grasped Coker clamps and dissected off the underlying rectus muscles. In a similar fashion the inferior aspect was dissected off the rectus muscles. Rectus muscles were separated in the midline and the peritoneal cavity entered bluntly. The peritoneal incision was then extended both superiorly and inferiorly with careful attention to avoid both bowel and bladder. The Alexis self-retaining wound retractor was then placed within the incision and the lower uterine segment exposed. The lower uterine segment was then incised in a curvilinear fashion and the cavity itself entered bluntly. The incision was extended bluntly. Amniotic sac was ruptured and fluid was noted to be clear in color. The infant's head was then lifted and delivered from the incision using the standard movements. The remainder of the infant delivered and the nose and mouth bulb suctioned with the cord clamped and cut as well. The infant was handed off to the waiting NICU staff. The placenta was  then spontaneously  expressed from the uterus and the uterus cleared of all clots and debris with moist lap sponge and ring forceps. The uterus was atonic which improved with massage and pitocin. The uterine incision was then repaired with a running locked layer of 0-vicryl suture. A hematoma along the left hysterotomy edge was observed and secured using a figure-of-eight stitch. The uterus was exteriorized and careful inspection performed. The hematoma was non-expanding and urine notes to be clear. The tubes and ovaries were inspected and the gutters cleared of all clots and debris. The uterine incision was inspected and found to be hemostatic. All instruments and sponges as well as the Alexis retractor were then removed from the abdomen. The peritoneum and rectus muscles were then reapproximated in a single layer using 2-0 vicryl suture. The fascia was then closed with 0 Vicryl in a running fashion beginning at each corner and tying at the midline. Subcutaneous tissue was reapproximated with 2-0 plain in a running fashion. The skin was closed with a subcuticular stitch of 4-0 Vicryl on a Keith needle and then reinforced with Prevena dressing. At the conclusion of the procedure all instruments and sponge counts were correct. Patient was taken to the recovery room in good condition with her baby accompanying her skin to skin.    Radley Barto, DO

## 2023-03-29 NOTE — Anesthesia Preprocedure Evaluation (Signed)
Anesthesia Evaluation  Patient identified by MRN, date of birth, ID band Patient awake    Reviewed: Allergy & Precautions, NPO status , Patient's Chart, lab work & pertinent test results, reviewed documented beta blocker date and time   History of Anesthesia Complications Negative for: history of anesthetic complications  Airway Mallampati: IV  TM Distance: >3 FB Neck ROM: Full    Dental no notable dental hx.    Pulmonary neg sleep apnea, neg COPD, neg PE   breath sounds clear to auscultation       Cardiovascular (-) hypertension(-) CAD, (-) Past MI and (-) Cardiac Stents  Rhythm:Regular Rate:Normal     Neuro/Psych neg Seizures PSYCHIATRIC DISORDERS Anxiety Depression       GI/Hepatic ,GERD  ,,(+) neg Cirrhosis        Endo/Other  diabetes, Type 2    Renal/GU      Musculoskeletal   Abdominal   Peds  Hematology   Anesthesia Other Findings   Reproductive/Obstetrics (+) Pregnancy                             Anesthesia Physical Anesthesia Plan  ASA: 3  Anesthesia Plan: Spinal   Post-op Pain Management:    Induction:   PONV Risk Score and Plan: 2 and Ondansetron and Dexamethasone  Airway Management Planned:   Additional Equipment:   Intra-op Plan:   Post-operative Plan:   Informed Consent: I have reviewed the patients History and Physical, chart, labs and discussed the procedure including the risks, benefits and alternatives for the proposed anesthesia with the patient or authorized representative who has indicated his/her understanding and acceptance.     Dental advisory given  Plan Discussed with: CRNA  Anesthesia Plan Comments:        Anesthesia Quick Evaluation

## 2023-03-29 NOTE — Progress Notes (Signed)
Subjective: Postpartum Day 0: Cesarean Delivery Patient reports pain controlled, no nausea or vomiting, tolerating p.o.  Has not yet been ambulatory.  Foley catheter in place.  Objective: Vital signs in last 24 hours: Temp:  [97.7 F (36.5 C)-98.5 F (36.9 C)] 97.7 F (36.5 C) (12/05 1000) Pulse Rate:  [74-104] 94 (12/05 0744) Resp:  [16-30] 18 (12/05 1000) BP: (120-170)/(88-109) 138/89 (12/05 1000) SpO2:  [93 %-100 %] 95 % (12/05 1000) Weight:  [161 kg] 112 kg (12/05 0125) Vitals:   03/29/23 0545 03/29/23 0635 03/29/23 0744 03/29/23 1000  BP: (!) 148/96 (!) 144/94 (!) 140/90 138/89  Pulse: 94 74 94   Resp: 16 18 18 18   Temp: 97.7 F (36.5 C) 98 F (36.7 C) 98.4 F (36.9 C) 97.7 F (36.5 C)  TempSrc: Axillary Axillary Oral Oral  SpO2:  100% 96% 95%  Weight:      Height:         Physical Exam:  General: alert, cooperative, and appears stated age 27: appropriate Uterine Fundus: firm Incision: Wound VAC in place, vacuum engaged DVT Evaluation: No evidence of DVT seen on physical exam.  SCDs on  Recent Labs    03/26/23 1512 03/29/23 0213  HGB 11.6* 12.0  HCT 36.2 37.1    Assessment/Plan: 1) Status post Cesarean section. Doing well postoperatively.  2) Continue current care. 3) Type 2 diabetes: Patient was on insulin in pregnancy.  Prepregnancy she was taking metformin.  Metformin has been restarted.  Initial blood glucoses have been elevated.  Will restart Lantus insulin at 20 units twice daily.  Patient was on 50 units twice daily during the pregnancy.  Also start sliding scale insulin.  Will monitor blood sugars to determine insulin requirements.  Diabetic coordinator consultation pending. 4) patient noted to have elevated blood pressures at the time of presentation.  Since delivery blood pressures have been in the mild range.  Patient started on Procardia 30. 5) low urine output for the 5 hours after delivery with concentrated appearing urine.  1 L IV fluid  bolus given.  Waynard Reeds, MD 03/29/2023, 12:21 PM

## 2023-03-29 NOTE — Inpatient Diabetes Management (Addendum)
Inpatient Diabetes Program Recommendations  AACE/ADA: New Consensus Statement on Inpatient Glycemic Control (2015)  Target Ranges:  Prepandial:   less than 140 mg/dL      Peak postprandial:   less than 180 mg/dL (1-2 hours)      Critically ill patients:  140 - 180 mg/dL   Lab Results  Component Value Date   GLUCAP 193 (H) 03/29/2023    Review of Glycemic Control  Latest Reference Range & Units 03/29/23 02:12 03/29/23 04:30 03/29/23 12:01  Glucose-Capillary 70 - 99 mg/dL 086 (H) 578 (H) 469 (H)  (H): Data is abnormally high  Diabetes history: DM2  Outpatient Diabetes medications:  Lantus 50 units BID, Novolog 25 units TID Prior to pregnancy-Metformin 1000 mg BID and Glipizide 2.5 mg every day  Current orders for Inpatient glycemic control: Metformin 1000 mg BID, Semglee 20 units BID, Novolog 0-9 units TID  Received decadron 10 mg early this morning.   Inpatient Diabetes Program Recommendations:    Might consider decreasing Semglee 20 units BID to Semglee 15 units every day.  Will follow glucose trends.   Spoke with patient at bedside.  She confirms above pregnancy and pre-pregnancy DM medications.  She received decadron early this morning which may be contributing to her elevated glucose.    Discussed hormonal changes and decrease in insulin resistance once placenta is delivered.  She plans to breast feed.  Educated on increased risk of hypoglycemia and importance of keeping a snack close by and being aware of hypoglycemia signs and symptoms.  She verbalizes understanding.  Will continue to follow while inpatient.  Thank you, Rhonda Sellar, MSN, CDCES Diabetes Coordinator Inpatient Diabetes Program [phone] (team pager from 8a-5p)

## 2023-03-29 NOTE — Anesthesia Procedure Notes (Signed)
Spinal  Patient location during procedure: OR Start time: 03/29/2023 2:50 AM End time: 03/29/2023 2:52 AM Reason for block: surgical anesthesia Staffing Performed: anesthesiologist  Anesthesiologist: Mariann Barter, MD Performed by: Mariann Barter, MD Authorized by: Mariann Barter, MD   Preanesthetic Checklist Completed: patient identified, IV checked, site marked, risks and benefits discussed, surgical consent, monitors and equipment checked, pre-op evaluation and timeout performed Spinal Block Patient position: sitting Prep: DuraPrep Patient monitoring: heart rate, cardiac monitor, continuous pulse ox and blood pressure Approach: midline Location: L3-4 Injection technique: single-shot Needle Needle type: Sprotte  Needle gauge: 24 G Needle length: 9 cm Assessment Sensory level: T4 Events: CSF return

## 2023-03-29 NOTE — Progress Notes (Signed)
PACU RN called Dr. Katrinka Blazing to let her know of patient's elevated blood pressures and PCR results. MD is aware and ordered Procardia to give up on the unit. Pt denies headache or blurred vision. Will give procardia on mother/baby and continue to monitor.

## 2023-03-29 NOTE — Transfer of Care (Signed)
Immediate Anesthesia Transfer of Care Note  Patient: Rhonda Cohen  Procedure(s) Performed: CESAREAN SECTION  Patient Location: PACU  Anesthesia Type:Spinal  Level of Consciousness: awake, alert , and oriented  Airway & Oxygen Therapy: Patient Spontanous Breathing  Post-op Assessment: Report given to RN and Post -op Vital signs reviewed and stable  Post vital signs: Reviewed and stable  Last Vitals:  Vitals Value Taken Time  BP 120/92 03/29/23 0425  Temp    Pulse 91 03/29/23 0425  Resp 21 03/29/23 0425  SpO2 95 % 03/29/23 0425  Vitals shown include unfiled device data.  Last Pain:  Vitals:   03/29/23 0125  TempSrc: Oral  PainSc: 5          Complications: No notable events documented.

## 2023-03-29 NOTE — Lactation Note (Signed)
This note was copied from a baby's chart. Lactation Consultation Note  Patient Name: [name] [id number] Date: 03/29/2023 Age:27 hours Reason for consult: Initial assessment;Primapara;Early term 37-38.6wks;Maternal endocrine disorder Baby in bassinet alert at first then fell asleep.  Discussed w/mom the probable need to supplement since baby is LGA and mom DM 2. Mom is fine w/what ever the baby needs. Baby has been given colostrum and spoon fed all ready recently. LC wanted to see if could collect more colostrum to give to baby. Mom in agreement. LC collected 4 ml. Mom in agreement to pumping for supplementation. Mom shown how to use DEBP & how to disassemble, clean, & reassemble parts. Colostrum containers reviewed as well as milk storage. Mom encouraged to feed baby 8-12 times/24 hours and with feeding cues.  Mom knows to watch for s/sx of hypoglycemia.  Mom very tired states needs some rest. Would like assistance trying to latch at next feeding. Asked mom to call for assistance as needed.  Maternal Data Has patient been taught Hand Expression?: Yes Does the patient have breastfeeding experience prior to this delivery?: No  Feeding    LATCH Score       Type of Nipple: Everted at rest and after stimulation  Comfort (Breast/Nipple): Soft / non-tender         Lactation Tools Discussed/Used Tools: Pump;Flanges Flange Size: 21 Breast pump type: Double-Electric Breast Pump Pump Education: Setup, frequency, and cleaning;Milk Storage Reason for Pumping: supplementation Pumping frequency: q 3hr  Interventions Interventions: Breast feeding basics reviewed;Hand express;Breast compression;Expressed milk;DEBP;Education;LC Services brochure  Discharge    Consult Status Consult Status: Follow-up Date: 03/29/23 Follow-up type: In-patient    Charyl Dancer 03/29/2023, 6:47 AM

## 2023-03-29 NOTE — H&P (Signed)
Rhonda Cohen is a 27 y.o. female presenting for rupture of membranes of clear fluid at 2315.   Pregnancy is complicated by:  - Pregnancy conceived via home insemination - T2DM: Lantus 50u BID, Novalog 25u w/ meals, Metformin 1000mg  at bedtime. Reports fasting 90s, 2 hour PP 120-130s. - Suspected macrosomia: EFW 4871, scheduled for PLTCS at 10 am today - Maternal obesity - Depression/Anxiety - Ex smoker: stopped vaping in 1st trimester  OB History     Gravida  1   Para      Term      Preterm      AB      Living         SAB      IAB      Ectopic      Multiple      Live Births             Past Medical History:  Diagnosis Date   Anxiety    Depression    Hyperlipidemia    Irritable bowel syndrome    Musculoskeletal abnormal finding on examination    congenital bilateral wrist deformities   PCOS (polycystic ovarian syndrome)    Type 2 diabetes mellitus (HCC)    Past Surgical History:  Procedure Laterality Date   TONSILLECTOMY     WRIST SURGERY Bilateral    Family History: family history includes Cancer in her maternal grandmother and mother; Down syndrome in her sister; Heart attack in her father. Social History:  reports that she has never smoked. She uses smokeless tobacco. She reports that she does not currently use alcohol. She reports that she does not use drugs.     Maternal Diabetes: Yes:  Diabetes Type:  Pre-pregnancy, Insulin/Medication controlled Genetic Screening: Declined Maternal Ultrasounds/Referrals: Suspected macrsomia Fetal Ultrasounds or other Referrals:  None Maternal Substance Abuse:  No Significant Maternal Medications:  Meds include: Other: Aspirin, Fluoxetine, Metfomrin, Novalog, Lantus, Omeprazole Significant Maternal Lab Results:  Group B Strep negative Number of Prenatal Visits:greater than 3 verified prenatal visits Maternal Vaccinations:TDap and Flu Other Comments:  None  Review of Systems  Constitutional:  Negative for  chills and fever.  Respiratory:  Negative for chest tightness and shortness of breath.   Cardiovascular:  Negative for chest pain.  Gastrointestinal:  Positive for abdominal pain.  Genitourinary:  Positive for pelvic pain, vaginal bleeding and vaginal discharge.   History Dilation: 1.5 Effacement (%): 70 Station: -3 Exam by:: Wynelle Bourgeois, CNM. Blood pressure (!) 156/88, pulse 93, temperature 98.4 F (36.9 C), temperature source Oral, resp. rate 19, height 5' (1.524 m), weight 112 kg, last menstrual period 06/21/2022, SpO2 97%. Exam Physical Exam Constitutional:      General: She is not in acute distress.    Appearance: She is obese.  HENT:     Head: Normocephalic and atraumatic.  Pulmonary:     Effort: Pulmonary effort is normal.  Musculoskeletal:        General: Deformity (bilateral wrist) present.  Skin:    General: Skin is warm and dry.  Neurological:     Mental Status: She is alert.  Psychiatric:        Mood and Affect: Mood normal.        Behavior: Behavior normal.     FHT: baseline 150s, minimal to moderate variability, accelerations absent, variable decelerations present Category: 2 UC: 2-4 min  Prenatal labs: ABO, Rh: --/--/O POS (12/02 1517) Antibody: NEG (12/02 1517) Rubella: Immune (08/07 0000) RPR: NON REACTIVE (12/02 1512)  HBsAg: Negative (08/07 0000)  HIV: Non-reactive (08/07 0000)  GBS:  negative  Assessment/Plan: 27 yo G1P0 @ 37.6 with PROM confirmed via fern - Admit - Suspected macrosomia: given EFW>4500 in setting of T2DM, delivery via PLTCS. Light meal @ 1945, per discussion with anesthesia can proceed with delivery as >6 hours from meal. Ancef/Azithro for surgical ppx - Elevated BP w/o Dx HTN: Severe range BP on arrival during CTX. Rpt non-severe. Ur Pr/Cr pending, other preE labs wnl. Labetalol IV PRN - T2DM: Took evening metformin. Pre-op glucose 132 - Anxiety/Depression: Continue home meds  Junius Creamer 03/29/2023, 2:19 AM

## 2023-03-29 NOTE — Lactation Note (Signed)
This note was copied from a baby's chart. Lactation Consultation Note  Patient Name: Rhonda Cohen [id number] Date: 03/29/2023 Age:28 hours  Reason for consult: Follow-up assessment;Primapara;1st time breastfeeding;Maternal endocrine disorder;Early term 37-38.6wks  P1, [redacted]w[redacted]d, C/S, Macrosomia, Type II Diabetic  Family is receptive to [facility] follow up. Baby has had low blood sugars (<20, 32. 55, 39) and baby is being fed formula. Birth Mother has been pumping and expressing 3-5 ml of colostrum. Skin to skin encouraged. Until blood sugars have stabilized, she plans to supplement with her EBM and formula. She is undecided about latching baby to the breast at this time. Discussed pumping every 3 hours in the initial setting for best stimulation of milk supply.   Instructed to call for assistance as needed.    Feeding Mother's Current Feeding Choice: Breast Milk and Formula Nipple Type: Slow - flow  Interventions Interventions: Education  Discharge Pump: DEBP;Personal  Consult Status Consult Status: Follow-up Date: 03/30/23 Follow-up type: In-patient    Christella Hartigan M 03/29/2023, 3:35 PM

## 2023-03-29 NOTE — MAU Note (Addendum)
.  Rhonda Cohen is a 27 y.o. at [redacted]w[redacted]d here in MAU reporting:    SROM at 2315 clear.  Ctxs 3-4 min apart. +Fm. No bleeding Pt states she is also feeling nauseas no vomiting.  Last time pt ate is 1945.   Scheduled C/s for today 03/29/23 at 10am. Was told to come in at 8am.   Pain score: 5/10  Vitals:   03/29/23 0125 03/29/23 0129  BP:  (!) 170/108  Pulse:  96  Temp: 98.4 F (36.9 C)      FHT:141 Lab orders placed from triage: Crist Fat slide.

## 2023-03-30 LAB — CBC
HCT: 26.9 % — ABNORMAL LOW (ref 36.0–46.0)
Hemoglobin: 8.7 g/dL — ABNORMAL LOW (ref 12.0–15.0)
MCH: 25.7 pg — ABNORMAL LOW (ref 26.0–34.0)
MCHC: 32.3 g/dL (ref 30.0–36.0)
MCV: 79.6 fL — ABNORMAL LOW (ref 80.0–100.0)
Platelets: 262 10*3/uL (ref 150–400)
RBC: 3.38 MIL/uL — ABNORMAL LOW (ref 3.87–5.11)
RDW: 15.9 % — ABNORMAL HIGH (ref 11.5–15.5)
WBC: 15.5 10*3/uL — ABNORMAL HIGH (ref 4.0–10.5)
nRBC: 0 % (ref 0.0–0.2)

## 2023-03-30 LAB — GLUCOSE, CAPILLARY
Glucose-Capillary: 64 mg/dL — ABNORMAL LOW (ref 70–99)
Glucose-Capillary: 85 mg/dL (ref 70–99)
Glucose-Capillary: 135 mg/dL — ABNORMAL HIGH (ref 70–99)
Glucose-Capillary: 100 mg/dL — ABNORMAL HIGH (ref 70–99)
Glucose-Capillary: 87 mg/dL (ref 70–99)

## 2023-03-30 MED ORDER — NIFEDIPINE ER OSMOTIC RELEASE 30 MG PO TB24
60.0000 mg | ORAL_TABLET | Freq: Every day | ORAL | Status: DC
  Administered 2023-03-31 – 2023-04-01 (×2): 60 mg via ORAL
  Filled 2023-03-30 (×2): qty 2

## 2023-03-30 MED ORDER — NIFEDIPINE ER OSMOTIC RELEASE 30 MG PO TB24
30.0000 mg | ORAL_TABLET | Freq: Once | ORAL | Status: AC
  Administered 2023-03-30: 30 mg via ORAL
  Filled 2023-03-30: qty 1

## 2023-03-30 NOTE — Inpatient Diabetes Management (Signed)
Inpatient Diabetes Program Recommendations  AACE/ADA: New Consensus Statement on Inpatient Glycemic Control (2015)  Target Ranges:  Prepandial:   less than 140 mg/dL      Peak postprandial:   less than 180 mg/dL (1-2 hours)      Critically ill patients:  140 - 180 mg/dL   Lab Results  Component Value Date   GLUCAP 85 03/30/2023    Review of Glycemic Control  Latest Reference Range & Units 03/29/23 18:22 03/29/23 22:43 03/30/23 07:31 03/30/23 07:55  Glucose-Capillary 70 - 99 mg/dL 010 (H) 272 (H) 64 (L) 85  (H): Data is abnormally high (L): Data is abnormally low  Diabetes history: DM2   Outpatient Diabetes medications:  Lantus 50 units BID, Novolog 25 units TID Prior to pregnancy-Metformin 1000 mg BID and Glipizide 2.5 mg every day   Current orders for Inpatient glycemic control: Metformin 1000 mg BID, Semglee 20 units BID, Novolog 0-9 units TID  Inpatient Diabetes Program Recommendations:    Please discontinue basal and correction insulin; continue Metformin.  Will continue to follow while inpatient.  Thank you, Dulce Sellar, MSN, CDCES Diabetes Coordinator Inpatient Diabetes Program [phone] (team pager from 8a-5p)

## 2023-03-30 NOTE — Social Work (Signed)
Patient screened out for psychosocial assessment since none of the following apply: Psychosocial stressors documented in mother or baby's chart Gestation less than 32 weeks Code at delivery  Infant with anomalies  Per chart review, MOB has history for anxiety and depression. MOB's symptoms currently being treated with Fluoxetine medication.   Please contact the Clinical Social Worker if specific needs arise, by MOB's request, or if MOB scores greater than 9/yes to question 10 on Edinburgh Postpartum Depression Screen.  Vivi Barrack, MSW, LCSW Clinical Social Worker  [phone] 07-20-2022  11:09 AM

## 2023-03-30 NOTE — Progress Notes (Signed)
Subjective: Postpartum Day 1: Cesarean Delivery Patient reports tolerating PO, + flatus, and no problems voiding.  She denies HA, CP or SOB. She c/o edema in hands/feet and abdomen. Baby in NICU - bonding well.   Objective: Vital signs in last 24 hours: Temp:  [98 F (36.7 C)-98.4 F (36.9 C)] 98.3 F (36.8 C) (12/06 1119) Pulse Rate:  [81-98] 95 (12/06 1119) Resp:  [16-18] 18 (12/06 1119) BP: (127-148)/(82-98) 146/98 (12/06 1119) SpO2:  [95 %-100 %] 97 % (12/06 0858)  Physical Exam:  General: alert, cooperative, and no distress Lochia: appropriate Uterine Fundus: firm Incision: no significant drainage DVT Evaluation: Calf/Ankle edema is present.  Recent Labs    03/29/23 0213 03/30/23 0724  HGB 12.0 8.7*  HCT 37.1 26.9*    Assessment/Plan: Status post Cesarean section. Doing well postoperatively.  -gtn BP still elevated 2 hrs after procardia 30xl- will add another dose of 30xl for 60xl total q am and will check BP routinely q 4hrs today while awake. Will adjust meds further as indicated -T2DM -  per diabetic coordinator, FSBS low on 20u insulin so need to d/C and continue on metformin only . Plan to continue cbg today and see if stable  - Edema - reassured  .- Routine pp/post op care  Cathrine Muster, DO 03/30/2023, 11:42 AM

## 2023-03-30 NOTE — Lactation Note (Signed)
This note was copied from a baby's chart.  NICU Lactation Consultation Note  Patient Name: Girl Carlethia Gornick [id number] Date: 03/30/2023 Age:28 hours  Reason for consult: Follow-up assessment; Primapara; 1st time breastfeeding; NICU baby; Maternal endocrine disorder; Early term 77-38.6wks; Other (Comment) (LGA, hypoglycemia) Type of Endocrine Disorder?: PCOS; Diabetes (T2DM (metformin, lantus))  SUBJECTIVE Visited with family of 58 hours old ETI NICU female; baby "Lilly" got admitted to the NICU due to hypoglycemia, she's currently on 22 calorie formula and still under hypoglycemia protocol. Ms. Mayall is a P1 and reported she's already pumping and getting enough colostrum to collect, praised her for her efforts. She's unsure about her feeding plan, she's still undecided about taking baby to breast, she voiced it depends on how her blood glucose goes. Reviewed pumping schedule, pump settings, pumping log, feeding cues, lactogenesis II/III and anticipatory guidelines.   OBJECTIVE Infant data: Mother's Current Feeding Choice: Breast Milk and Formula  O2 Device: Room Air  Infant feeding assessment Scale for Readiness: 1 Scale for Quality: 2   Maternal data: G1P1001 C-Section, Low Transverse Has patient been taught Hand Expression?: Yes Hand Expression Comments: colostrum noted Significant Breast History:: (+) breast changes during the pregnancy Current breast feeding challenges:: NICU admission Does the patient have breastfeeding experience prior to this delivery?: No Pumping frequency: 2-3 times/24 hours but she has also been hand expressing Pumped volume: 2 mL (2-5 ml) Flange Size: 21 Hands-free pumping top sizes: -- (Has her own pumping bra) Risk factor for low/delayed milk supply:: primpara, T2DM (uncontrolled), PPH 1099 cc., infant separation  Pump: Personal (Unsure of brand)  ASSESSMENT Infant: Feeding Status: Ad lib Feeding method: Bottle Nipple Type: Slow -  flow  Maternal: Milk volume: Normal  INTERVENTIONS/PLAN Interventions: Interventions: Breast feeding basics reviewed; DEBP; Education; NICU Pumping Log; CDC Guidelines for Breast Pump Cleaning; Coconut oil Tools: Pump; Flanges; Hands-free pumping top Pump Education: Setup, frequency, and cleaning; Milk Storage  Plan: Encouraged pumping every 3 hours, ideally 8 pumping sessions/24 hours Breast massage, hand expression and coconut oil were also encouraged prior pumping She'll call out for assistance if she decides taking baby "Lilly" to breast  Female visitor present. All questions and concerns answered, family to contact [facility] services PRN.  Consult Status: Follow-up NICU Follow-up type: Maternal D/C visit   Audyn Dimercurio Venetia Constable 03/30/2023, 10:38 AM

## 2023-03-30 NOTE — Anesthesia Postprocedure Evaluation (Signed)
Anesthesia Post Note  Patient: Rhonda Cohen  Procedure(s) Performed: CESAREAN SECTION     Patient location during evaluation: PACU Anesthesia Type: Spinal Level of consciousness: awake and alert Pain management: pain level controlled Vital Signs Assessment: post-procedure vital signs reviewed and stable Respiratory status: spontaneous breathing, nonlabored ventilation and respiratory function stable Cardiovascular status: stable Postop Assessment: no headache, no backache and epidural receding Anesthetic complications: no   No notable events documented.                  Mariann Barter

## 2023-03-30 NOTE — Progress Notes (Signed)
CBG 64. Apple juice given and pt eating breakfast at this time. Asked NT to recheck in 15 minutes and let dayshift RN know

## 2023-03-31 LAB — GLUCOSE, CAPILLARY
Glucose-Capillary: 106 mg/dL — ABNORMAL HIGH (ref 70–99)
Glucose-Capillary: 213 mg/dL — ABNORMAL HIGH (ref 70–99)
Glucose-Capillary: 102 mg/dL — ABNORMAL HIGH (ref 70–99)
Glucose-Capillary: 111 mg/dL — ABNORMAL HIGH (ref 70–99)

## 2023-03-31 MED ORDER — LABETALOL HCL 100 MG PO TABS
100.0000 mg | ORAL_TABLET | Freq: Three times a day (TID) | ORAL | Status: DC
  Administered 2023-03-31 – 2023-04-01 (×3): 100 mg via ORAL
  Filled 2023-03-31 (×3): qty 1

## 2023-03-31 MED ORDER — LABETALOL HCL 100 MG PO TABS
100.0000 mg | ORAL_TABLET | Freq: Once | ORAL | Status: AC
  Administered 2023-03-31: 100 mg via ORAL
  Filled 2023-03-31: qty 1

## 2023-03-31 MED ORDER — INSULIN ASPART 100 UNIT/ML IJ SOLN: 0.0000 [IU] | Freq: Three times a day (TID) | INTRAMUSCULAR | Status: DC

## 2023-03-31 NOTE — Lactation Note (Signed)
This note was copied from a baby's chart. Lactation Consultation Note  Patient Name: Rhonda Cohen [id number] Date: 03/31/2023 Age:29 hours Reason for consult: Follow-up assessment;Early term 37-38.6wks;Exclusive pumping and bottle feeding  P1, 37 wks, @ 59 hrs of age. Baby in 504 with mom today, mom pumping with LC arrival. Mom filling bottles, discussed moving up bottle sizes- 2 oz collected this pumping. Mom is [employer] employee- has not received a pump. Form completed- options offered- mom chooses sonata pump- pump provided to mom. Per mom discharge could be a possibility today. Handouts re-enforced on O/P LC visits, milk storage, and pump cleaning. Engorgement prevention and treatment discussed.   Maternal Data   PCOS, Diabetes  Feeding Mother's Current Feeding Choice: Breast Milk and Formula Nipple Type: Slow - flow   Lactation Tools Discussed/Used Tools: Coconut oil;Bottle Breast pump type: Double-Electric Breast Pump Pump Education: Milk Storage;Setup, frequency, and cleaning Reason for Pumping: Mom pump and feed, infant was in NICU for blood sugar issues Pumping frequency: Every 3 hours Pumped volume: 60 mL  Interventions Interventions: Hand express;Expressed milk;Coconut oil;DEBP;Education;LC Services brochure;CDC Guidelines for Breast Pump Cleaning (Milk Storage Guidelines)  Discharge Discharge Education: Engorgement and breast care Pump: DEBP ([employer] employee breast pump form completed- sonata pump to patient)  Consult Status Consult Status: Follow-up Date: 04/01/23 Follow-up type: In-patient    [facility] 03/31/2023, 2:54 PM

## 2023-03-31 NOTE — Progress Notes (Signed)
Insulin coverage was not given due to order parameters not met.

## 2023-03-31 NOTE — Progress Notes (Signed)
Subjective: Postpartum Day 2: Cesarean Delivery Patient is doing well this morning. Pain is controlled. Ambulating, voiding, tolerating PO. Minimal lochia. Pumping.  No headache, vision changes, RUQ pain, chest pain, shortness of breath.   Objective: Patient Vitals for the past 24 hrs:  BP Temp Temp src Pulse Resp SpO2  03/31/23 1021 127/87 -- -- (!) 101 16 99 %  03/31/23 0616 (!) 142/99 -- -- (!) 102 -- --  03/31/23 0606 (!) 142/99 -- -- -- -- --  03/31/23 0508 (!) 145/98 98.1 F (36.7 C) Oral (!) 102 14 97 %  03/31/23 0119 125/83 -- -- -- -- --  03/30/23 2134 (!) 136/98 98.7 F (37.1 C) Oral 99 16 99 %  03/30/23 1709 (!) 135/90 -- -- -- -- --  03/30/23 1211 (!) 141/93 98.2 F (36.8 C) Oral 92 19 --  03/30/23 1119 (!) 146/98 98.3 F (36.8 C) Oral 95 18 --    Physical Exam:  General: alert, cooperative, and no distress Lochia: appropriate Uterine Fundus: firm Incision: Prevena in place with dry  tubing DVT Evaluation: No evidence of DVT seen on physical exam.  Recent Labs    03/29/23 0213 03/30/23 0724  HGB 12.0 8.7*  HCT 37.1 26.9*    Assessment/Plan: Rhonda Cohen G1P1001 POD#2 sp CS at [redacted]w[redacted]d for macrosomia 1. PPC: routine PP care 2. GHTN: on Procardia XL 60mg , now Labetalol 100mg  TID added this morning with good response. Monitor.  3. T2DM: Diabetes RN following. Metformin 1000mg  at bedtime restarted and Lantus stopped. Will monitor.  4. DVT prophylaxis: lovenox 5. Dispo: patient desires discharge home today if possible. Baby due for bili check this afternoon. Will monitor her CBG and Bps today and reevaluate for discharge this afternoon.   [name] 03/31/2023, 10:56 AM

## 2023-03-31 NOTE — Progress Notes (Signed)
Patient reports that she felt like her blood sugar was low this morning around 0500. She did not call out, but did have a snack of graham crackers and peanut butter and symptoms resolved. CBG this morning 106.

## 2023-04-01 LAB — GLUCOSE, CAPILLARY: Glucose-Capillary: 93 mg/dL (ref 70–99)

## 2023-04-01 MED ORDER — OXYCODONE HCL 5 MG PO TABS: 5.0000 mg | ORAL_TABLET | ORAL | 0 refills | Status: DC | PRN

## 2023-04-01 MED ORDER — LABETALOL HCL 100 MG PO TABS: 100.0000 mg | ORAL_TABLET | Freq: Three times a day (TID) | ORAL | 11 refills | Status: DC

## 2023-04-01 MED ORDER — IBUPROFEN 600 MG PO TABS: 600.0000 mg | ORAL_TABLET | Freq: Four times a day (QID) | ORAL | 1 refills | Status: DC | PRN

## 2023-04-01 MED ORDER — NIFEDIPINE ER 60 MG PO TB24: 60.0000 mg | ORAL_TABLET | Freq: Every day | ORAL | 11 refills | Status: DC

## 2023-04-01 MED ORDER — ACETAMINOPHEN 500 MG PO TABS: 1000.0000 mg | ORAL_TABLET | Freq: Four times a day (QID) | ORAL | 0 refills | Status: AC | PRN

## 2023-04-01 MED ORDER — METFORMIN HCL 1000 MG PO TABS: 1000.0000 mg | ORAL_TABLET | Freq: Every day | ORAL | 3 refills | Status: DC

## 2023-04-01 NOTE — Discharge Summary (Signed)
Postpartum Discharge Summary  Date of Service updated 04/01/23      Patient Name: Rhonda Cohen DOB: [birth year] MRN: [id number]  Date of admission: 03/29/2023 Delivery date:03/29/2023 Delivering provider: Truett Perna A Date of discharge: 04/01/2023  Admitting diagnosis: Fetal macrosomia during pregnancy [O36.60X0] Type 2 diabetes mellitus affecting pregnancy in third trimester, antepartum [O24.113] Intrauterine pregnancy: [redacted]w[redacted]d     Secondary diagnosis:  Principal Problem:   Fetal macrosomia during pregnancy Active Problems:   Type 2 diabetes mellitus affecting pregnancy in third trimester, antepartum  Additional problems: obesity, depression/anxiety    Discharge diagnosis: Term Pregnancy Delivered, Gestational Hypertension, and Type 2 DM                                              Post partum procedures: none Augmentation: N/A Complications: None  Hospital course: Sceduled C/S   27 y.o. yo G1P1001 at [redacted]w[redacted]d was admitted to the hospital 03/29/2023 for cesarean section with the following indication: rupture of membranes, fetal macrosomia .Delivery details are as follows:  Membrane Rupture Time/Date: 11:15 PM,03/28/2023  Delivery Method:C-Section, Low Transverse Operative Delivery:N/A Details of operation can be found in separate operative note.  Patient had a postpartum course complicated by elevated blood pressures, was started on antihypertensives. On day of discharge, blood pressures controlled on Procardia XL 60mg  daily and labetalol 100mg  TID. Her insulin was stopped and blood sugars reasonably controlled on metformin 1000mg  at bedtime.  She is ambulating, tolerating a regular diet, passing flatus, and urinating well. Patient is discharged home in stable condition on  04/01/23        Newborn Data: Birth date:03/29/2023 Birth time:3:25 AM Gender:Female Living status:Living Apgars:7 ,9  Weight:5400 g    Magnesium Sulfate received: No BMZ received:  No Rhophylac:N/A MMR:N/A T-DaP:Given prenatally Flu: Given prenatally RSV Vaccine received: No Transfusion:No Immunizations administered: Immunization History  Administered Date(s) Administered   PFIZER(Purple Top)SARS-COV-2 Vaccination 10/13/2019    Physical exam  Vitals:   03/31/23 1738 03/31/23 2105 04/01/23 0516 04/01/23 0820  BP: 123/84 132/87 113/66 125/80  Pulse: 86 91 99 91  Resp: 17 18 18 16   Temp: 98.2 F (36.8 C) 98 F (36.7 C) 98.1 F (36.7 C) 98.2 F (36.8 C)  TempSrc: Oral  Oral   SpO2: 98%  99% 97%  Weight:      Height:       General: alert, cooperative, and no distress Lochia: appropriate Uterine Fundus: firm Incision: Prevena in place DVT Evaluation: No evidence of DVT seen on physical exam. Labs: Lab Results  Component Value Date   WBC 15.5 (H) 03/30/2023   HGB 8.7 (L) 03/30/2023   HCT 26.9 (L) 03/30/2023   MCV 79.6 (L) 03/30/2023   PLT 262 03/30/2023      Latest Ref Rng & Units 03/29/2023    2:13 AM  CMP  Glucose 70 - 99 mg/dL 409   BUN 6 - 20 mg/dL 9   Creatinine 8.11 - 9.14 mg/dL 7.82   Sodium 956 - 213 mmol/L 134   Potassium 3.5 - 5.1 mmol/L 4.2   Chloride 98 - 111 mmol/L 104   CO2 22 - 32 mmol/L 19   Calcium 8.9 - 10.3 mg/dL 9.3   Total Protein 6.5 - 8.1 g/dL 6.1   Total Bilirubin <0.8 mg/dL 0.6   Alkaline Phos 38 - 126 U/L 91   AST 15 -  41 U/L 16   ALT 0 - 44 U/L 11    Edinburgh Score:    03/30/2023   11:17 AM  Edinburgh Postnatal Depression Scale Screening Tool  I have been able to laugh and see the funny side of things. 0  I have looked forward with enjoyment to things. 0  I have blamed myself unnecessarily when things went wrong. 2  I have been anxious or worried for no good reason. 0  I have felt scared or panicky for no good reason. 0  Things have been getting on top of me. 1  I have been so unhappy that I have had difficulty sleeping. 0  I have felt sad or miserable. 0  I have been so unhappy that I have been  crying. 0  The thought of harming myself has occurred to me. 0  Edinburgh Postnatal Depression Scale Total 3      After visit meds:  Allergies as of 04/01/2023       Reactions   Sulfa Antibiotics Other (See Comments)   Unknown childhood allergy         Medication List     STOP taking these medications    aspirin EC 81 MG tablet   insulin glargine 100 UNIT/ML injection Commonly known as: Lantus   NovoLOG 100 UNIT/ML injection Generic drug: insulin aspart   ondansetron 4 MG disintegrating tablet Commonly known as: ZOFRAN-ODT   ondansetron 8 MG disintegrating tablet Commonly known as: ZOFRAN-ODT   polyethylene glycol 17 g packet Commonly known as: MiraLax       TAKE these medications    acetaminophen 500 MG tablet Commonly known as: TYLENOL Take 2 tablets (1,000 mg total) by mouth every 6 (six) hours as needed. What changed: reasons to take this   CALCIUM PO Take 1 tablet by mouth daily.   FLUoxetine 20 MG capsule Commonly known as: PROZAC Take 1 capsule (20 mg total) by mouth daily.   hydrocortisone 25 MG suppository Commonly known as: ANUSOL-HC Place 1 suppository (25 mg total) rectally 2 (two) times daily. What changed:  when to take this reasons to take this   ibuprofen 600 MG tablet Commonly known as: ADVIL Take 1 tablet (600 mg total) by mouth every 6 (six) hours as needed.   labetalol 100 MG tablet Commonly known as: NORMODYNE Take 1 tablet (100 mg total) by mouth 3 (three) times daily.   Lidocaine 4 % Gel Apply 1 g topically 3 (three) times daily as needed (rectal pain). To rectum   MAGNESIUM PO Take 1 tablet by mouth daily.   metFORMIN 1000 MG tablet Commonly known as: GLUCOPHAGE Take 1 tablet (1,000 mg total) by mouth daily with supper.   NIFEdipine 60 MG 24 hr tablet Commonly known as: ADALAT CC Take 1 tablet (60 mg total) by mouth daily.   omeprazole 40 MG capsule Commonly known as: PRILOSEC Take 1 capsule (40 mg total) by  mouth daily.   oxyCODONE 5 MG immediate release tablet Commonly known as: Oxy IR/ROXICODONE Take 1 tablet (5 mg total) by mouth every 4 (four) hours as needed for moderate pain (pain score 4-6).   phenylephrine 0.25 % suppository Commonly known as: (USE for PREPARATION-H) Place 1 suppository rectally 2 (two) times daily. What changed:  when to take this reasons to take this   PRENATAL AD PO Take 1 tablet by mouth daily.               Discharge Care Instructions  (From admission,  onward)           Start     Ordered   04/01/23 0000  Discharge wound care:       Comments: Remove Prevena dressing on 12/12   04/01/23 9604             Discharge home in stable condition Infant Feeding: Breast Infant Disposition:home with mother Discharge instruction: per After Visit Summary and Postpartum booklet. Activity: Advance as tolerated. Pelvic rest for 6 weeks.  Diet: carb modified diet Anticipated Birth Control: Unsure Postpartum Appointment:4 weeks Additional Postpartum F/U: Incision check 1 week and BP check 1 week Future Appointments:No future appointments. Follow up Visit:  Follow-up Information     Ob/Gyn, Nestor Ramp. Schedule an appointment as soon as possible for a visit.   Why: Blood pressure and incision check 12/12 or 12/13 Contact information: [address] [place] [address] [phone]                     04/01/2023 [name], MD

## 2023-04-01 NOTE — Progress Notes (Signed)
Pt was switched to portable Prevena wound vac, repeatedly alarming leak.  Multiple attempts to patch dressing, alarm persists.  Removing Prevena dressing, apply honeycomb dressing to incision for discharge home per Dr. Timothy Lasso.

## 2023-04-01 NOTE — Progress Notes (Signed)
Dr. Timothy Lasso en route to complete Prevena Dressing removal.

## 2023-04-01 NOTE — Progress Notes (Signed)
Subjective: Postpartum Day 3: Cesarean Delivery Patient is doing well this morning. Pain is controlled. Ambulating, voiding, tolerating PO. Minimal lochia. Pumping.  No headache, vision changes, RUQ pain, chest pain, shortness of breath. Baby kept overnight for phototherapy, stopped this morning.    Objective: Patient Vitals for the past 24 hrs:  BP Temp Temp src Pulse Resp SpO2  04/01/23 0820 125/80 98.2 F (36.8 C) -- 91 16 97 %  04/01/23 0516 113/66 98.1 F (36.7 C) Oral 99 18 99 %  03/31/23 2105 132/87 98 F (36.7 C) -- 91 18 --  03/31/23 1738 123/84 98.2 F (36.8 C) Oral 86 17 98 %  03/31/23 1516 (!) 138/91 -- -- 85 18 99 %  03/31/23 1021 127/87 -- -- (!) 101 16 99 %    Physical Exam:  General: alert, cooperative, and no distress Lochia: appropriate Uterine Fundus: firm Incision: Prevena in place with dry  tubing DVT Evaluation: No evidence of DVT seen on physical exam.  Recent Labs    03/30/23 0724  HGB 8.7*  HCT 26.9*    Assessment/Plan: Rhonda Cohen G1P1001 POD#3 sp CS at [redacted]w[redacted]d for macrosomia 1. PPC: routine PP care 2. GHTN: blood pressures well controlled on Procardia XL 60mg  and Labetalol 100mg  TID  3. T2DM: Diabetes RN following. Metformin 1000mg  at bedtime restarted and Lantus stopped. CBG 213 yesterday 1 hour after having pancakes with syrup, otherwise well controlled.  4. DVT prophylaxis: lovenox 5. Dispo: discharge home today with BP and incision check in office this week.   [name] 04/01/2023, 9:12 AM

## 2023-04-01 NOTE — Progress Notes (Signed)
Patient is asleep so q4 hour vital were not take at 1 am. Will continue to monitor for safety.

## 2023-04-09 ENCOUNTER — Telehealth (HOSPITAL_COMMUNITY): Payer: Self-pay | Admitting: *Deleted

## 2023-04-09 NOTE — Telephone Encounter (Signed)
04/09/2023  Name: Rhonda Cohen MRN: [id number] DOB: 1995-12-24  Reason for Call:  Transition of Care Hospital Discharge Call  Contact Status: Patient Contact Status: Message  Language assistant needed:          Follow-Up Questions:    Inocente Salles Postnatal Depression Scale:  In the Past 7 Days:    PHQ2-9 Depression Scale:     Discharge Follow-up:    Post-discharge interventions: NA  Salena Saner, RN 04/09/2023 11:37

## 2023-08-27 ENCOUNTER — Emergency Department (HOSPITAL_BASED_OUTPATIENT_CLINIC_OR_DEPARTMENT_OTHER)
Admission: EM | Admit: 2023-08-27 | Discharge: 2023-08-27 | Disposition: A | Discharge status: Home / Self Care | Attending: Emergency Medicine | Admitting: Emergency Medicine

## 2023-08-27 ENCOUNTER — Other Ambulatory Visit: Payer: Self-pay

## 2023-08-27 ENCOUNTER — Encounter (HOSPITAL_BASED_OUTPATIENT_CLINIC_OR_DEPARTMENT_OTHER): Payer: Self-pay | Admitting: Emergency Medicine

## 2023-08-27 DIAGNOSIS — Z7984 Long term (current) use of oral hypoglycemic drugs: Secondary | ICD-10-CM | POA: Diagnosis not present

## 2023-08-27 DIAGNOSIS — E119 Type 2 diabetes mellitus without complications: Secondary | ICD-10-CM | POA: Diagnosis not present

## 2023-08-27 DIAGNOSIS — L03213 Periorbital cellulitis: Secondary | ICD-10-CM | POA: Diagnosis not present

## 2023-08-27 DIAGNOSIS — H00012 Hordeolum externum right lower eyelid: Secondary | ICD-10-CM | POA: Insufficient documentation

## 2023-08-27 DIAGNOSIS — R220 Localized swelling, mass and lump, head: Secondary | ICD-10-CM | POA: Diagnosis present

## 2023-08-27 MED ORDER — FLUORESCEIN SODIUM 1 MG OP STRP
1.0000 | ORAL_STRIP | Freq: Once | OPHTHALMIC | Status: DC
  Filled 2023-08-27: qty 1

## 2023-08-27 MED ORDER — ERYTHROMYCIN 5 MG/GM OP OINT
TOPICAL_OINTMENT | Freq: Once | OPHTHALMIC | Status: AC
  Administered 2023-08-27: 1 via OPHTHALMIC
  Filled 2023-08-27: qty 3.5

## 2023-08-27 MED ORDER — AMOXICILLIN-POT CLAVULANATE 875-125 MG PO TABS: 1.0000 | ORAL_TABLET | Freq: Two times a day (BID) | ORAL | 0 refills | Status: DC

## 2023-08-27 MED ORDER — TETRACAINE HCL 0.5 % OP SOLN
2.0000 [drp] | Freq: Once | OPHTHALMIC | Status: AC
  Administered 2023-08-27: 2 [drp] via OPHTHALMIC
  Filled 2023-08-27: qty 4

## 2023-08-27 NOTE — ED Provider Notes (Signed)
 [place] EMERGENCY DEPARTMENT AT [facility] Provider Note   CSN: [id number] Arrival date & time: 08/27/23  1440     History  Chief Complaint  Patient presents with   Facial Swelling    Rhonda Cohen is a 28 y.o. female.  27y/o female with hx of diabetes, hyperlipidemia, congenital bilateral wrist deformities and PCOS who is presenting today with a 1 day history of worsening right lower eyelid pain, facial pain and redness.  She reports last week she had a stye on her right upper lid that popped and drained pus and she was doing okay until this started today when she woke up.  She states her vision seems a little blurry in her right eye and the pain seems to be worse if she bends over.  She denies any fevers.  The history is provided by the patient.       Home Medications Prior to Admission medications   Medication Sig Start Date End Date Taking? Authorizing Provider  amoxicillin-clavulanate (AUGMENTIN) 875-125 MG tablet Take 1 tablet by mouth every 12 (twelve) hours. 08/27/23  Yes Almond Army, MD  acetaminophen  (TYLENOL ) 500 MG tablet Take 2 tablets (1,000 mg total) by mouth every 6 (six) hours as needed. 04/01/23   Marinone, Michelle E, MD  CALCIUM PO Take 1 tablet by mouth daily.    [provider]  FLUoxetine  (PROZAC ) 20 MG capsule Take 1 capsule (20 mg total) by mouth daily. 12/21/22     hydrocortisone  (ANUSOL -HC) 25 MG suppository Place 1 suppository (25 mg total) rectally 2 (two) times daily. Patient taking differently: Place 25 mg rectally 2 (two) times daily as needed for hemorrhoids. 02/11/23   Maud Sorenson, MD  ibuprofen  (ADVIL ) 600 MG tablet Take 1 tablet (600 mg total) by mouth every 6 (six) hours as needed. 04/01/23   Theodoro Fisherman, MD  labetalol  (NORMODYNE ) 100 MG tablet Take 1 tablet (100 mg total) by mouth 3 (three) times daily. 04/01/23   Marinone, Michelle E, MD  Lidocaine  4 % GEL Apply 1 g topically 3 (three) times daily as  needed (rectal pain). To rectum 02/11/23   Maud Sorenson, MD  MAGNESIUM PO Take 1 tablet by mouth daily.    [provider]  metFORMIN  (GLUCOPHAGE ) 1000 MG tablet Take 1 tablet (1,000 mg total) by mouth daily with supper. 04/01/23   Theodoro Fisherman, MD  NIFEdipine  (ADALAT  CC) 60 MG 24 hr tablet Take 1 tablet (60 mg total) by mouth daily. 04/01/23   Theodoro Fisherman, MD  omeprazole  (PRILOSEC) 40 MG capsule Take 1 capsule (40 mg total) by mouth daily. 12/21/22     oxyCODONE  (OXY IR/ROXICODONE ) 5 MG immediate release tablet Take 1 tablet (5 mg total) by mouth every 4 (four) hours as needed for moderate pain (pain score 4-6). 04/01/23   Theodoro Fisherman, MD  phenylephrine  (,USE FOR PREPARATION-H,) 0.25 % suppository Place 1 suppository rectally 2 (two) times daily. Patient taking differently: Place 1 suppository rectally 2 (two) times daily as needed for hemorrhoids. 02/11/23   Maud Sorenson, MD  Prenatal Vit-DSS-Fe Cbn-FA (PRENATAL AD PO) Take 1 tablet by mouth daily.    [provider]      Allergies    Sulfa antibiotics    Review of Systems   Review of Systems  Physical Exam Updated Vital Signs BP (!) 141/97 (BP Location: Right Arm)   Pulse 82   Temp 98.4 F (36.9 C) (Oral)   Resp 18  SpO2 95%  Physical Exam Vitals and nursing note reviewed.  HENT:     Head: Normocephalic.  Eyes:     General: Vision grossly intact.     Intraocular pressure: Right eye pressure is 20 mmHg.      Comments: Slightly injected right eye.  Mild redness and warmth noted to the right upper cheek below the lid.  Extraocular movements are intact with minimal pain.  Pulmonary:     Effort: Pulmonary effort is normal.  Musculoskeletal:     Comments: Deformity of bilateral arms  Neurological:     Mental Status: She is alert. Mental status is at baseline.     ED Results / Procedures / Treatments   Labs (all labs ordered are listed, but only abnormal results are  displayed) Labs Reviewed - No data to display  EKG None  Radiology No results found.  Procedures Procedures    Medications Ordered in ED Medications  tetracaine (PONTOCAINE) 0.5 % ophthalmic solution 2 drop (has no administration in time range)  fluorescein ophthalmic strip 1 strip (has no administration in time range)  erythromycin ophthalmic ointment (has no administration in time range)    ED Course/ Medical Decision Making/ A&P                                 Medical Decision Making Risk Prescription drug management.   Patient presenting today with evidence of a stye forming on her right lower lid but also associated redness and swelling and a known history of diabetes.  No findings to suggest orbital cellulitis but concern for developing preseptal cellulitis in addition to developing stye.  Patient's pressure and vision are normal.  At this time patient started on erythromycin ointment and oral Augmentin.  Given return precautions.        Final Clinical Impression(s) / ED Diagnoses Final diagnoses:  Hordeolum externum of right lower eyelid  Preseptal cellulitis of right lower eyelid    Rx / DC Orders ED Discharge Orders          Ordered    amoxicillin-clavulanate (AUGMENTIN) 875-125 MG tablet  Every 12 hours        08/27/23 1620              Almond Army, MD 08/27/23 1625

## 2023-08-27 NOTE — ED Triage Notes (Signed)
 Right eye sty popped last week Redness on eye Facial swelling and pain/pressure into cheek and moth Photo sensitive

## 2023-08-27 NOTE — ED Provider Triage Note (Signed)
 Emergency Medicine Provider Triage Evaluation Note  Rhonda Cohen , a 28 y.o. female  was evaluated in triage.  Pt complains of right eye pain, right eye lid irritation and redness. Had a stye that started draining.   Review of Systems  Positive: Right eye pain Negative: Vision change  Physical Exam  BP (!) 141/97 (BP Location: Right Arm)   Pulse 82   Temp 98.4 F (36.9 C) (Oral)   Resp 18   SpO2 95%  Gen:   Awake, no distress   Resp:  Normal effort  MSK:   Moves extremites without difficulty  Other:  Right eye lid redness, swelling  Medical Decision Making  Medically screening exam initiated at 3:09 PM.  Appropriate orders placed.  Rhonda Cohen was informed that the remainder of the evaluation will be completed by another provider, this initial triage assessment does not replace that evaluation, and the importance of remaining in the ED until their evaluation is complete.     Rhonda Heron, PA-C 08/27/23 1510

## 2023-08-27 NOTE — Discharge Instructions (Signed)
 Use the eye ointment on the lower lid 3 times a day.  And start the antibiotics.  You can take tylenol  and ibuprofen  as needed for pain.  Follow up with your doctor/urgent care or er if it is not getting better.  Use warm and cool compresses.

## 2023-10-10 ENCOUNTER — Encounter: Payer: Self-pay | Admitting: Family Medicine

## 2023-10-10 ENCOUNTER — Ambulatory Visit: Admitting: Family Medicine

## 2023-10-10 VITALS — BP 133/87 | HR 71 | Ht 60.0 in | Wt 223.6 lb

## 2023-10-10 DIAGNOSIS — Z8742 Personal history of other diseases of the female genital tract: Secondary | ICD-10-CM

## 2023-10-10 DIAGNOSIS — Z13220 Encounter for screening for lipoid disorders: Secondary | ICD-10-CM

## 2023-10-10 DIAGNOSIS — E1165 Type 2 diabetes mellitus with hyperglycemia: Secondary | ICD-10-CM

## 2023-10-10 DIAGNOSIS — F419 Anxiety disorder, unspecified: Secondary | ICD-10-CM

## 2023-10-10 DIAGNOSIS — K219 Gastro-esophageal reflux disease without esophagitis: Secondary | ICD-10-CM

## 2023-10-10 DIAGNOSIS — Z130 Encounter for screening for diseases of the blood and blood-forming organs and certain disorders involving the immune mechanism: Secondary | ICD-10-CM

## 2023-10-10 DIAGNOSIS — E282 Polycystic ovarian syndrome: Secondary | ICD-10-CM

## 2023-10-10 DIAGNOSIS — E66813 Obesity, class 3: Secondary | ICD-10-CM

## 2023-10-10 DIAGNOSIS — Z8759 Personal history of other complications of pregnancy, childbirth and the puerperium: Secondary | ICD-10-CM

## 2023-10-10 DIAGNOSIS — Z6841 Body Mass Index (BMI) 40.0 and over, adult: Secondary | ICD-10-CM

## 2023-10-10 DIAGNOSIS — R030 Elevated blood-pressure reading, without diagnosis of hypertension: Secondary | ICD-10-CM | POA: Insufficient documentation

## 2023-10-10 DIAGNOSIS — F32A Depression, unspecified: Secondary | ICD-10-CM

## 2023-10-10 DIAGNOSIS — Z7984 Long term (current) use of oral hypoglycemic drugs: Secondary | ICD-10-CM

## 2023-10-10 DIAGNOSIS — Z7689 Persons encountering health services in other specified circumstances: Secondary | ICD-10-CM

## 2023-10-10 MED ORDER — TIRZEPATIDE 2.5 MG/0.5ML ~~LOC~~ SOAJ: 2.5000 mg | SUBCUTANEOUS | 1 refills | Status: DC

## 2023-10-10 NOTE — Progress Notes (Addendum)
 New Patient Office Visit  Introduced to nurse practitioner role and practice setting.  All questions answered.  Discussed provider/patient relationship and expectations.   Subjective   Patient ID: Rhonda Cohen, female    DOB: 04-Jan-1996  Age: 28 y.o. MRN: [id number]  Chief Complaint  Patient presents with   New Patient (Initial Visit)    Patient is present to establish care and discuss starting weight loss injections. She reports weight loss goal of 170 lbs. She is not consuming a certain diet and currently not exercising. Previously took mounjaro before she was pregnant   Care Management    Foot exam due Ophthalmology Exam - needs eye doctor Cervical Cancer Screening - due for one in one month HPV Vaccines -     Discussed the use of AI scribe software for clinical note transcription with the patient, who gave verbal consent to proceed.  History of Present Illness Zachary Velez is a 28 year old female who presents for establishing care with a new primary care provider.  She has a history of type 2 diabetes, currently managed with metformin  1000 mg daily, but experiences significant gastrointestinal side effects, describing it as 'tearing my stomach up horribly.' Her last hemoglobin A1c was 7.0% six months ago, with a previous high of 9.0%. She was on a mounjaro prior to pregnancy, which improved her blood sugar control, but discontinued it during pregnancy. She is interested in restarting it at a lower dose due to previous provider starting her on it at 7.5, and pt experience significant side effects initially.She also has taken glipizide but that was discontinued prior to pregnancy as well due to contraindications with fetus.    She has hyperlipidemia, previously managed with atorvastatin, which was discontinued during pregnancy. She is unsure of her current cholesterol levels.  She experiences GERD and takes daily omeprazole  for symptom mgmt and relief.   She also has a history  of anxiety and depression, managed with Prozac  20 mg, which she does not take consistently.  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 She also has a history of PCOS, which has affected her menstrual regularity, especially postpartum. She has had two menstrual cycles since delivery, she states she is nearly 7 months post partum.   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 She also had her tonsils and adenoids removed due to recurrent strep throat in childhood.  She had a cesarean section due to her baby being large, with the last ultrasound estimating the baby at 10 pounds 12 ounces. Postpartum, she experienced hypertension, which resolved without medication.  She has a sulfa allergy from childhood, though she does not recall the specific reaction.  She lives with her fiance and two children, a stepson who is nearly two years old and a seven-month-old daughter. She works for [employer]  She has a Nurse, mental health and five snakes.  She has no major concerns today, she just would like to get established with PCP and ensure labs/medications.   Review of Systems  All other systems reviewed and are negative.   Negative unless indicated in HPI   Objective:     BP 133/87 (BP Location: Left Arm, Patient Position: Sitting, Cuff Size: Normal)   Pulse 71   Ht 5' (1.524 m)   Wt 223 lb 9.6 oz (101.4 kg)   LMP 07/29/2023   Breastfeeding No   BMI 43.67 kg/m    Physical  Exam Constitutional:      General: She is not in acute distress.    Appearance: Normal appearance. She is obese. She is not ill-appearing, toxic-appearing or diaphoretic.  HENT:     Head: Normocephalic.     Right Ear: Hearing, ear canal and external ear normal. There is impacted cerumen.     Left Ear: Hearing, ear canal and external ear normal. There is impacted cerumen.     Ears:     Comments: Bilateral impacted cerumen, not bothersome to pt.      Nose: Nose normal.     Mouth/Throat:     Mouth: Mucous membranes are moist.     Pharynx: Oropharynx is clear. No oropharyngeal exudate or posterior oropharyngeal erythema.   Eyes:     General: Lids are normal.     Extraocular Movements: Extraocular movements intact.     Right eye: Normal extraocular motion.     Left eye: Normal extraocular motion.     Conjunctiva/sclera: Conjunctivae normal.     Right eye: Right conjunctiva is not injected.     Left eye: Left conjunctiva is not injected.     Pupils: Pupils are equal, round, and reactive to light.   Neck:     Thyroid: No thyroid mass, thyromegaly or thyroid tenderness.   Cardiovascular:     Rate and Rhythm: Normal rate.     Pulses: Normal pulses.          Radial pulses are 2+ on the right side and 2+ on the left side.       Posterior tibial pulses are 2+ on the right side and 2+ on the left side.     Heart sounds: Normal heart sounds, S1 normal and S2 normal. No murmur heard.    No friction rub. No gallop.  Pulmonary:     Effort: Pulmonary effort is normal. No respiratory distress.     Breath sounds: Normal breath sounds. No stridor. No wheezing, rhonchi or rales.  Abdominal:     General: Bowel sounds are normal. There is no distension.     Palpations: Abdomen is soft. There is no mass.     Tenderness: There is no abdominal tenderness. There is no guarding or rebound.     Hernia: No hernia is present.   Musculoskeletal:        General: Deformity present. No swelling or tenderness. Normal range of motion.     Cervical back: Normal range of motion. No rigidity.     Comments: Madelung deformity bilateral, baseline since birth  Lymphadenopathy:     Cervical: No cervical adenopathy.     Right cervical: No superficial, deep or posterior cervical adenopathy.    Left cervical: No superficial, deep or posterior cervical adenopathy.   Skin:    General: Skin is warm and dry.     Capillary Refill: Capillary refill takes less than 2  seconds.     Findings: No bruising or erythema.   Neurological:     General: No focal deficit present.     Mental Status: She is alert and oriented to person, place, and time. Mental status is at baseline.     GCS: GCS eye subscore is 4. GCS verbal subscore is 5. GCS motor subscore is 6.     Cranial Nerves: No cranial nerve deficit.     Sensory: No sensory deficit.     Motor: No weakness, tremor or pronator drift.     Coordination: Romberg sign negative.     Gait:  Gait is intact. Gait normal.   Psychiatric:        Attention and Perception: Attention and perception normal.        Mood and Affect: Mood and affect normal.        Speech: Speech normal.        Behavior: Behavior normal. Behavior is cooperative.        Thought Content: Thought content normal.        Cognition and Memory: Cognition and memory normal.        Judgment: Judgment normal.      No results found for any visits on 10/10/23.    The ASCVD Risk score (Arnett DK, et al., 2019) failed to calculate for the following reasons:   The 2019 ASCVD risk score is only valid for ages 53 to 19    Assessment & Plan:  Type 2 diabetes mellitus with hyperglycemia, without long-term current use of insulin  (HCC) -     Comprehensive metabolic panel with GFR -     Hemoglobin A1c -     Microalbumin / creatinine urine ratio -     Referral to Nutrition and Diabetes Services -     Tirzepatide; Inject 2.5 mg into the skin once a week.  Dispense: 2 mL; Refill: 1  Lipid screening -     Lipid Panel With LDL/HDL Ratio  Class 3 severe obesity due to excess calories without serious comorbidity with body mass index (BMI) of 40.0 to 44.9 in adult -     TSH  PCOS (polycystic ovarian syndrome) -     Ambulatory referral to Obstetrics / Gynecology  Gastroesophageal reflux disease without esophagitis  Anxiety and depression  History of gestational hypertension -     Comprehensive metabolic panel with GFR  Encounter to establish care  with new doctor  Elevated blood pressure, situational -     Comprehensive metabolic panel with GFR  Screening for deficiency anemia -     CBC  History of abnormal cervical Pap smear -     Ambulatory referral to Obstetrics / Gynecology    Assessment and Plan Assessment & Plan Type 2 Diabetes Mellitus Type 2 diabetes with elevated A1c, previously managed with metformin  and glipizide. Mounjaro effective but discontinued during pregnancy. Interested in restarting Mounjaro at a lower dose. - Check A1c level. - Start Mounjaro at 2.5 mg weekly. - Continue metformin  1000 mg daily. - Refer to diabetes nutrition management specialist. - Monitor blood sugar levels regularly. - Will check feet at next appt.  Obesity Obesity with weight management challenges. Previously with use of mounjaro for DMII she was having success with weight loss side effects - Referral to Nutrition placed - Discuss lifestyle modifications including diet and exercise.  Hyperlipidemia Hyperlipidemia previously managed with atorvastatin, discontinued during pregnancy. Current lipid levels unknown. - Check lipid levels. - Consider restarting atorvastatin based on lipid profile results.  Hypertension Postpartum hypertension resolved.  Current blood pressure slightly elevated, possibly due to anxiety during the visit. GOAL<120/80 - Monitor blood pressure at home weekly. - Low sodium - Decrease caffeine, increase water as drink of choice - Encourage lifestyle modifications to manage blood pressure.  Gastroesophageal Reflux Disease (GERD) GERD managed with omeprazole . No current symptoms reported. - Continue omeprazole  40 mg daily.  Anxiety and Depression Anxiety and depression managed with Prozac  20 mg daily. Reports satisfaction with current management despite inconsistent adherence. - Continue Prozac  20 mg daily. - Encourage consistent daily use.  Polycystic Ovary Syndrome (PCOS) PCOS with  irregular  menstrual cycles postpartum. - Monitor menstrual cycle regularity. - Will place referral to OB/GYN as requested  General Health Maintenance Discussion of PAP smear, eye exams, and foot care due to diabetes. Discussed HPV vaccine benefits. - Refer to OB/GYN for PAP smear given abnormal history and PCOS - Discuss HPV vaccine benefits- pt will think about it. - Recommend annual eye exams fpr DMII - pt would like to hold off until open enrollment for insurance - Advise on foot care to prevent diabetic complications.  Follow-up Plan for follow-up to assess management of diabetes and other conditions. - Schedule follow-up appointment in 3-4 months. - Review lab results and adjust treatment as necessary.  Will communicate labs  Return in about 3 months (around 01/10/2024).   I, Tasia Farr, FNP, have reviewed all documentation for this visit. The documentation on 10/10/23 for the exam, diagnosis, procedures, and orders are all accurate and complete.  Tasia Farr, FNP

## 2023-10-11 ENCOUNTER — Ambulatory Visit: Payer: Self-pay | Admitting: Family Medicine

## 2023-10-11 LAB — CBC
Hematocrit: 40.9 % (ref 34.0–46.6)
Hemoglobin: 12.8 g/dL (ref 11.1–15.9)
MCH: 25.2 pg — ABNORMAL LOW (ref 26.6–33.0)
MCHC: 31.3 g/dL — ABNORMAL LOW (ref 31.5–35.7)
MCV: 81 fL (ref 79–97)
Platelets: 464 10*3/uL — ABNORMAL HIGH (ref 150–450)
RBC: 5.08 x10E6/uL (ref 3.77–5.28)
RDW: 17.8 % — ABNORMAL HIGH (ref 11.7–15.4)
WBC: 9.8 10*3/uL (ref 3.4–10.8)

## 2023-10-11 LAB — MICROALBUMIN / CREATININE URINE RATIO
Creatinine, Urine: 92.4 mg/dL
Microalb/Creat Ratio: 115 mg/g{creat} — ABNORMAL HIGH (ref 0–29)
Microalbumin, Urine: 106.3 ug/mL

## 2023-10-11 LAB — COMPREHENSIVE METABOLIC PANEL WITH GFR
ALT: 53 IU/L — ABNORMAL HIGH (ref 0–32)
AST: 47 IU/L — ABNORMAL HIGH (ref 0–40)
Albumin: 4.7 g/dL (ref 4.0–5.0)
Alkaline Phosphatase: 79 IU/L (ref 44–121)
BUN/Creatinine Ratio: 17 (ref 9–23)
BUN: 11 mg/dL (ref 6–20)
Bilirubin Total: 0.2 mg/dL (ref 0.0–1.2)
CO2: 22 mmol/L (ref 20–29)
Calcium: 9.5 mg/dL (ref 8.7–10.2)
Chloride: 100 mmol/L (ref 96–106)
Creatinine, Ser: 0.66 mg/dL (ref 0.57–1.00)
Globulin, Total: 2.9 g/dL (ref 1.5–4.5)
Glucose: 186 mg/dL — ABNORMAL HIGH (ref 70–99)
Potassium: 4.3 mmol/L (ref 3.5–5.2)
Sodium: 139 mmol/L (ref 134–144)
Total Protein: 7.6 g/dL (ref 6.0–8.5)
eGFR: 122 mL/min/{1.73_m2} (ref >59)

## 2023-10-11 LAB — HEMOGLOBIN A1C
Est. average glucose Bld gHb Est-mCnc: 206 mg/dL
Hgb A1c MFr Bld: 8.8 % — ABNORMAL HIGH (ref 4.8–5.6)

## 2023-10-11 LAB — LIPID PANEL WITH LDL/HDL RATIO
Cholesterol, Total: 283 mg/dL — ABNORMAL HIGH (ref 100–199)
HDL: 38 mg/dL — ABNORMAL LOW (ref >39)
LDL Chol Calc (NIH): 193 mg/dL — ABNORMAL HIGH (ref 0–99)
LDL/HDL Ratio: 5.1 ratio — ABNORMAL HIGH (ref 0.0–3.2)
Triglycerides: 263 mg/dL — ABNORMAL HIGH (ref 0–149)
VLDL Cholesterol Cal: 52 mg/dL — ABNORMAL HIGH (ref 5–40)

## 2023-10-11 LAB — TSH: TSH: 42.8 u[IU]/mL — ABNORMAL HIGH (ref 0.450–4.500)

## 2023-10-15 ENCOUNTER — Telehealth: Payer: Self-pay

## 2023-10-15 ENCOUNTER — Encounter: Payer: Self-pay | Admitting: Family Medicine

## 2023-10-15 DIAGNOSIS — E1169 Type 2 diabetes mellitus with other specified complication: Secondary | ICD-10-CM

## 2023-10-15 DIAGNOSIS — Z79899 Other long term (current) drug therapy: Secondary | ICD-10-CM

## 2023-10-15 MED ORDER — ATORVASTATIN CALCIUM 80 MG PO TABS: 80.0000 mg | ORAL_TABLET | Freq: Every day | ORAL | 3 refills | Status: DC

## 2023-10-15 NOTE — Telephone Encounter (Signed)
 Send Lipitor 80 mg to pharmacy in [place] on 10/16/23 if patient has not returned message by end of day on 10/15/23   Copied from CRM #[id number]. Topic: Clinical - Lab/Test Results >> Oct 12, 2023  2:38 PM Montie POUR wrote: Reason for CRM:  I read her the lab results from her chart dated 10/11/23. She did see them on MyChart. The only question she has will Dr. Sharma change any of her medications. Please call or send her a message through MyChart. Thanks

## 2023-10-16 NOTE — Telephone Encounter (Signed)
 Patient advised, Lipitor sent to pharmacy and f/u labs have been ordered

## 2023-10-19 ENCOUNTER — Encounter: Payer: Self-pay | Admitting: Obstetrics and Gynecology

## 2023-10-30 ENCOUNTER — Telehealth: Payer: Self-pay

## 2023-10-30 NOTE — Telephone Encounter (Signed)
 Pharmacy Patient Advocate Encounter   Received notification from CoverMyMeds that prior authorization for Mounjaro  2.5MG /0.5ML auto-injectors is required/requested.   Insurance verification completed.   The patient is insured through [facility] .   Per test claim: PA required; PA submitted to above mentioned insurance via CoverMyMeds Key/confirmation #/EOC [id number] Status is pending

## 2023-10-30 NOTE — Telephone Encounter (Signed)
 Pharmacy Patient Advocate Encounter  Received notification from [facility] that Prior Authorization for Mounjaro  has been APPROVED from 10/30/2023 to 10/29/2024   PA #/Case ID/Reference #: [id number]

## 2023-12-27 ENCOUNTER — Encounter: Payer: Self-pay | Admitting: Family Medicine

## 2023-12-27 ENCOUNTER — Telehealth: Admitting: Family Medicine

## 2023-12-27 DIAGNOSIS — Z5321 Procedure and treatment not carried out due to patient leaving prior to being seen by health care provider: Secondary | ICD-10-CM

## 2023-12-27 DIAGNOSIS — M533 Sacrococcygeal disorders, not elsewhere classified: Secondary | ICD-10-CM

## 2023-12-27 NOTE — Progress Notes (Signed)
 Waited for patient to connect to virtual visit for 10 mins from 4:23P until 4:33PM  Sent link two messages to patient's mobile  number  Please reschedule patient for visit to discuss coccyx pain

## 2024-01-03 ENCOUNTER — Emergency Department (HOSPITAL_BASED_OUTPATIENT_CLINIC_OR_DEPARTMENT_OTHER): Admission: EM | Admit: 2024-01-03 | Discharge: 2024-01-03 | Disposition: A | Discharge status: Home / Self Care

## 2024-01-03 ENCOUNTER — Emergency Department (HOSPITAL_BASED_OUTPATIENT_CLINIC_OR_DEPARTMENT_OTHER): Admitting: Radiology

## 2024-01-03 ENCOUNTER — Encounter (HOSPITAL_BASED_OUTPATIENT_CLINIC_OR_DEPARTMENT_OTHER): Payer: Self-pay | Admitting: Emergency Medicine

## 2024-01-03 ENCOUNTER — Emergency Department (HOSPITAL_BASED_OUTPATIENT_CLINIC_OR_DEPARTMENT_OTHER)

## 2024-01-03 ENCOUNTER — Other Ambulatory Visit: Payer: Self-pay

## 2024-01-03 DIAGNOSIS — D72829 Elevated white blood cell count, unspecified: Secondary | ICD-10-CM | POA: Diagnosis not present

## 2024-01-03 DIAGNOSIS — R000 Tachycardia, unspecified: Secondary | ICD-10-CM | POA: Diagnosis present

## 2024-01-03 DIAGNOSIS — J069 Acute upper respiratory infection, unspecified: Secondary | ICD-10-CM | POA: Insufficient documentation

## 2024-01-03 DIAGNOSIS — R059 Cough, unspecified: Secondary | ICD-10-CM | POA: Diagnosis not present

## 2024-01-03 LAB — D-DIMER, QUANTITATIVE: D-Dimer, Quant: 0.55 ug{FEU}/mL — ABNORMAL HIGH (ref 0.00–0.50)

## 2024-01-03 LAB — URINALYSIS, MICROSCOPIC (REFLEX)

## 2024-01-03 LAB — COMPREHENSIVE METABOLIC PANEL WITH GFR
ALT: 53 U/L — ABNORMAL HIGH (ref 0–44)
AST: 30 U/L (ref 15–41)
Albumin: 4.4 g/dL (ref 3.5–5.0)
Alkaline Phosphatase: 89 U/L (ref 38–126)
Anion gap: 16 — ABNORMAL HIGH (ref 5–15)
BUN: 10 mg/dL (ref 6–20)
CO2: 22 mmol/L (ref 22–32)
Calcium: 9.7 mg/dL (ref 8.9–10.3)
Chloride: 98 mmol/L (ref 98–111)
Creatinine, Ser: 0.68 mg/dL (ref 0.44–1.00)
GFR, Estimated: >60 mL/min (ref >60)
Glucose, Bld: 173 mg/dL — ABNORMAL HIGH (ref 70–99)
Potassium: 3.7 mmol/L (ref 3.5–5.1)
Sodium: 135 mmol/L (ref 135–145)
Total Bilirubin: 0.4 mg/dL (ref 0.0–1.2)
Total Protein: 8 g/dL (ref 6.5–8.1)

## 2024-01-03 LAB — RESP PANEL BY RT-PCR (RSV, FLU A&B, COVID)  RVPGX2
Influenza A by PCR: NEGATIVE
Influenza B by PCR: NEGATIVE
Resp Syncytial Virus by PCR: NEGATIVE
SARS Coronavirus 2 by RT PCR: NEGATIVE

## 2024-01-03 LAB — URINALYSIS, ROUTINE W REFLEX MICROSCOPIC
Bilirubin Urine: NEGATIVE
Glucose, UA: NEGATIVE mg/dL
Hgb urine dipstick: NEGATIVE
Ketones, ur: NEGATIVE mg/dL
Nitrite: NEGATIVE
Protein, ur: NEGATIVE mg/dL
Specific Gravity, Urine: 1.01 (ref 1.005–1.030)
pH: 6 (ref 5.0–8.0)

## 2024-01-03 LAB — CBC WITH DIFFERENTIAL/PLATELET
Abs Immature Granulocytes: 0.06 K/uL (ref 0.00–0.07)
Basophils Absolute: 0.1 K/uL (ref 0.0–0.1)
Basophils Relative: 0 %
Eosinophils Absolute: 0.3 K/uL (ref 0.0–0.5)
Eosinophils Relative: 2 %
HCT: 38 % (ref 36.0–46.0)
Hemoglobin: 12.7 g/dL (ref 12.0–15.0)
Immature Granulocytes: 0 %
Lymphocytes Relative: 14 %
Lymphs Abs: 1.9 K/uL (ref 0.7–4.0)
MCH: 25.8 pg — ABNORMAL LOW (ref 26.0–34.0)
MCHC: 33.4 g/dL (ref 30.0–36.0)
MCV: 77.2 fL — ABNORMAL LOW (ref 80.0–100.0)
Monocytes Absolute: 1 K/uL (ref 0.1–1.0)
Monocytes Relative: 7 %
Neutro Abs: 10.9 K/uL — ABNORMAL HIGH (ref 1.7–7.7)
Neutrophils Relative %: 77 %
Platelets: 398 K/uL (ref 150–400)
RBC: 4.92 MIL/uL (ref 3.87–5.11)
RDW: 13.6 % (ref 11.5–15.5)
WBC: 14.2 K/uL — ABNORMAL HIGH (ref 4.0–10.5)
nRBC: 0 % (ref 0.0–0.2)

## 2024-01-03 LAB — TROPONIN T, HIGH SENSITIVITY
Troponin T High Sensitivity: <15 ng/L (ref 0–19)
Troponin T High Sensitivity: <15 ng/L (ref 0–19)

## 2024-01-03 LAB — LACTIC ACID, PLASMA: Lactic Acid, Venous: 1.4 mmol/L (ref 0.5–1.9)

## 2024-01-03 LAB — CBC
HCT: 41 % (ref 36.0–46.0)
Hemoglobin: 13.5 g/dL (ref 12.0–15.0)
MCH: 25.3 pg — ABNORMAL LOW (ref 26.0–34.0)
MCHC: 32.9 g/dL (ref 30.0–36.0)
MCV: 76.9 fL — ABNORMAL LOW (ref 80.0–100.0)
Platelets: 443 K/uL — ABNORMAL HIGH (ref 150–400)
RBC: 5.33 MIL/uL — ABNORMAL HIGH (ref 3.87–5.11)
RDW: 13.6 % (ref 11.5–15.5)
WBC: 15.1 K/uL — ABNORMAL HIGH (ref 4.0–10.5)
nRBC: 0 % (ref 0.0–0.2)

## 2024-01-03 LAB — HCG, SERUM, QUALITATIVE: Preg, Serum: NEGATIVE

## 2024-01-03 LAB — MAGNESIUM: Magnesium: 1.6 mg/dL — ABNORMAL LOW (ref 1.7–2.4)

## 2024-01-03 MED ORDER — IOHEXOL 350 MG/ML SOLN: 75.0000 mL | Freq: Once | INTRAVENOUS | Status: DC | PRN

## 2024-01-03 MED ORDER — MAGNESIUM SULFATE 2 GM/50ML IV SOLN
2.0000 g | Freq: Once | INTRAVENOUS | Status: AC
  Administered 2024-01-03: 2 g via INTRAVENOUS
  Filled 2024-01-03: qty 50

## 2024-01-03 MED ORDER — BENZONATATE 100 MG PO CAPS: 100.0000 mg | ORAL_CAPSULE | Freq: Three times a day (TID) | ORAL | 0 refills | Status: DC

## 2024-01-03 MED ORDER — IOHEXOL 350 MG/ML SOLN
100.0000 mL | Freq: Once | INTRAVENOUS | Status: AC | PRN
  Administered 2024-01-03: 100 mL via INTRAVENOUS

## 2024-01-03 MED ORDER — SODIUM CHLORIDE 0.9 % IV BOLUS
1000.0000 mL | Freq: Once | INTRAVENOUS | Status: AC
  Administered 2024-01-03: 1000 mL via INTRAVENOUS

## 2024-01-03 NOTE — ED Provider Notes (Signed)
 [place] EMERGENCY DEPARTMENT AT [facility] Provider Note   CSN: [id number] Arrival date & time: 01/03/24  9280     Patient presents with: Tachycardia   Rhonda Cohen is a 28 y.o. female.   This is a 28 year old female presenting emergency department with elevated heart rate.  Reports that she has had a mild nonproductive cough for the past 2 days.  Has been taking Delsym and Cold-Eeze cough drops.  Last night while she was at work she noted her heart rate was in the 130s to 150s.  Has had decreased p.o. intake.  Having some chest pain while coughing, but none currently or at rest.  No hemoptysis or shortness of breath.  Denying nausea vomiting, abdominal pain.  Low risk for PE based on Wells criteria.  Denies drinking significant caffeine or stimulant use.        Prior to Admission medications   Medication Sig Start Date End Date Taking? Authorizing Provider  benzonatate  (TESSALON ) 100 MG capsule Take 1 capsule (100 mg total) by mouth every 8 (eight) hours. 01/03/24  Yes Pearlean Sabina, Caron PARAS, DO  acetaminophen  (TYLENOL ) 500 MG tablet Take 2 tablets (1,000 mg total) by mouth every 6 (six) hours as needed. 04/01/23   Diedre Rosaline BRAVO, MD  atorvastatin  (LIPITOR) 80 MG tablet Take 1 tablet (80 mg total) by mouth daily. 10/15/23   Simmons-Robinson, Rockie, MD  FLUoxetine  (PROZAC ) 20 MG capsule Take 1 capsule (20 mg total) by mouth daily. 12/21/22     ibuprofen  (ADVIL ) 600 MG tablet Take 1 tablet (600 mg total) by mouth every 6 (six) hours as needed. 04/01/23   Diedre Rosaline BRAVO, MD  metFORMIN  (GLUCOPHAGE ) 1000 MG tablet Take 1 tablet (1,000 mg total) by mouth daily with supper. 04/01/23   Diedre Rosaline BRAVO, MD  omeprazole  (PRILOSEC) 40 MG capsule Take 1 capsule (40 mg total) by mouth daily. 12/21/22     tirzepatide  (MOUNJARO ) 2.5 MG/0.5ML Pen Inject 2.5 mg into the skin once a week. 10/10/23   Wellington Curtis LABOR, FNP    Allergies: Sulfa antibiotics    Review of  Systems  Updated Vital Signs BP 106/69   Pulse 93   Temp 98.9 F (37.2 C) (Oral)   Resp 10   SpO2 92%   Physical Exam Vitals and nursing note reviewed.  Constitutional:      General: She is not in acute distress.    Appearance: She is not toxic-appearing.  HENT:     Head: Normocephalic.     Nose: Nose normal.     Mouth/Throat:     Mouth: Mucous membranes are moist.  Eyes:     Conjunctiva/sclera: Conjunctivae normal.  Cardiovascular:     Rate and Rhythm: Regular rhythm. Tachycardia present.     Pulses: Normal pulses.  Pulmonary:     Effort: Pulmonary effort is normal.     Breath sounds: Normal breath sounds.  Abdominal:     General: Abdomen is flat. There is no distension.     Palpations: Abdomen is soft.     Tenderness: There is no abdominal tenderness. There is no guarding or rebound.  Musculoskeletal:     Right lower leg: No edema.     Left lower leg: No edema.  Skin:    General: Skin is warm.     Capillary Refill: Capillary refill takes less than 2 seconds.  Neurological:     Mental Status: She is alert and oriented to person, place, and time.  Psychiatric:  Mood and Affect: Mood normal.        Behavior: Behavior normal.     (all labs ordered are listed, but only abnormal results are displayed) Labs Reviewed  CBC - Abnormal; Notable for the following components:      Result Value   WBC 15.1 (*)    RBC 5.33 (*)    MCV 76.9 (*)    MCH 25.3 (*)    Platelets 443 (*)    All other components within normal limits  COMPREHENSIVE METABOLIC PANEL WITH GFR - Abnormal; Notable for the following components:   Glucose, Bld 173 (*)    ALT 53 (*)    Anion gap 16 (*)    All other components within normal limits  MAGNESIUM  - Abnormal; Notable for the following components:   Magnesium  1.6 (*)    All other components within normal limits  D-DIMER, QUANTITATIVE - Abnormal; Notable for the following components:   D-Dimer, Quant 0.55 (*)    All other components  within normal limits  URINALYSIS, ROUTINE W REFLEX MICROSCOPIC - Abnormal; Notable for the following components:   Leukocytes,Ua SMALL (*)    All other components within normal limits  CBC WITH DIFFERENTIAL/PLATELET - Abnormal; Notable for the following components:   WBC 14.2 (*)    MCV 77.2 (*)    MCH 25.8 (*)    Neutro Abs 10.9 (*)    All other components within normal limits  URINALYSIS, MICROSCOPIC (REFLEX) - Abnormal; Notable for the following components:   Bacteria, UA FEW (*)    All other components within normal limits  RESP PANEL BY RT-PCR (RSV, FLU A&B, COVID)  RVPGX2  CULTURE, BLOOD (ROUTINE X 2)  CULTURE, BLOOD (ROUTINE X 2)  HCG, SERUM, QUALITATIVE  LACTIC ACID, PLASMA  TROPONIN T, HIGH SENSITIVITY  TROPONIN T, HIGH SENSITIVITY    EKG: EKG Interpretation Date/Time:  Thursday January 03 2024 07:27:50 EDT Ventricular Rate:  133 PR Interval:  158 QRS Duration:  64 QT Interval:  314 QTC Calculation: 467 R Axis:   32  Text Interpretation: Sinus tachycardia Borderline T abnormalities, anterior leads Baseline wander in lead(s) V6 Confirmed by Neysa Clap [phone]) on 01/03/2024 7:29:20 AM  Radiology: CT Angio Chest PE W and/or Wo Contrast Result Date: 01/03/2024 CLINICAL DATA:  Cough, tachycardia, positive D-dimer. EXAM: CT ANGIOGRAPHY CHEST WITH CONTRAST TECHNIQUE: Multidetector CT imaging of the chest was performed using the standard protocol during bolus administration of intravenous contrast. Multiplanar CT image reconstructions and MIPs were obtained to evaluate the vascular anatomy. RADIATION DOSE REDUCTION: This exam was performed according to the departmental dose-optimization program which includes automated exposure control, adjustment of the mA and/or kV according to patient size and/or use of iterative reconstruction technique. CONTRAST:  OMNIPAQUE  IOHEXOL  350 MG/ML SOLN COMPARISON:  None Available. FINDINGS: Cardiovascular: Satisfactory opacification of  the pulmonary arteries to the segmental level. No evidence of pulmonary embolism. Normal heart size. No pericardial effusion. Mediastinum/Nodes: No enlarged mediastinal, hilar, or axillary lymph nodes. Thyroid gland, trachea, and esophagus demonstrate no significant findings. Lungs/Pleura: Lungs are clear. No pleural effusion or pneumothorax. Upper Abdomen: Hepatic steatosis. Musculoskeletal: No chest wall abnormality. No acute or significant osseous findings. Review of the MIP images confirms the above findings. IMPRESSION: 1. No definite evidence of pulmonary embolus. 2. Hepatic steatosis. Electronically Signed   By: Lynwood Landy Raddle M.D.   On: 01/03/2024 10:12   DG Chest 2 View Result Date: 01/03/2024 CLINICAL DATA:  28 year old female with cough.  Tachycardia. EXAM:  CHEST - 2 VIEW COMPARISON:  CT Abdomen and Pelvis 04/10/2022. FINDINGS: PA and lateral views. Low normal lung volumes. Normal cardiac size and mediastinal contours. Visualized tracheal air column is within normal limits. Both lungs appear clear. No pneumothorax or pleural effusion. Negative visible bowel gas.  No osseous abnormality identified. IMPRESSION: Negative.  No acute cardiopulmonary abnormality. Electronically Signed   By: VEAR Hurst M.D.   On: 01/03/2024 08:09     Procedures   Medications Ordered in the ED  sodium chloride  0.9 % bolus 1,000 mL ( Intravenous Stopped 01/03/24 0945)  magnesium  sulfate IVPB 2 g 50 mL (0 g Intravenous Stopped 01/03/24 1021)  sodium chloride  0.9 % bolus 1,000 mL (0 mLs Intravenous Stopped 01/03/24 1137)  iohexol  (OMNIPAQUE ) 350 MG/ML injection 100 mL (100 mLs Intravenous Contrast Given 01/03/24 0949)    Clinical Course as of 01/03/24 1543  Thu Jan 03, 2024  0920 Lactic Acid, Venous: 1.4 [TY]  0920 CBC with Differential(!) Leukocytosis.  Does have a left shift.  No overt bacterial infection identified on exam.  Chest x-ray is clear awaiting UA. [TY]  9077 D-Dimer, Quant(!): 0.55 Given patient's  tachycardia and mild elevation we will get CTA to evaluate for PE. [TY]  1010 CT Angio Chest PE W and/or Wo Contrast Do not appreciate pneumonia or pneumothorax.  Do not see large saddle PE or segmental PE on my independent review of images. [TY]  1030 Heart rate currently in the 90s after IVFs [TY]  1036 CT Angio Chest PE W and/or Wo Contrast IMPRESSION: 1. No definite evidence of pulmonary embolus. 2. Hepatic steatosis.   Electronically Signed   [TY]  1047 Urinalysis, Routine w reflex microscopic -Urine, Clean Catch(!) Not consistent with urinary tract infection [TY]  1048 Patient's heart rate has improved considerably.  History provided by patient with likely viral URI and I suspect her elevated heart rate secondary to dehydration and to the Delsym that she has been taking.  Her magnesium  was low and was repleted.  Her CTA was negative, clinically no signs of DVT.  Discussed supportive care increasing fluid intake and follow-up with PCP.  Patient voiced understanding and agreeable to discharge. [TY]    Clinical Course User Index [TY] Neysa Caron PARAS, DO                                 Medical Decision Making This is a 28 year old female presenting emergency department with elevated heart rate and cough.  She is afebrile, appears to be sinus tachycardia with a heart rate in the 130s on the monitor as independently interpreted by me.  Blood pressure is 126/88.  Clinically is well-appearing, clear lungs.  Will get screening labs to evaluate for electrolyte abnormalities, chest x-ray to evaluate for pneumonia, IV fluids to treat heart rate as she has had decreased p.o. intake.  Will also evaluate for PE with D-dimer as she is otherwise low risk.  If positive will get CTA.  Her symptoms however could be secondary to Delsym use.  ED course for further MDM and final disposition.  Amount and/or Complexity of Data Reviewed External Data Reviewed:     Details: Has PCOS, anxiety depression  hyperlipidemia and diabetes Labs: ordered. Decision-making details documented in ED Course. Radiology: ordered and independent interpretation performed. Decision-making details documented in ED Course. ECG/medicine tests: independent interpretation performed. Decision-making details documented in ED Course.    Details: EKG appears to be  normal sinus rhythm at a rate of 133 bpm.  No ST segment changes to indicate ischemia.  Normal intervals.  QTc 467.  Risk Prescription drug management. Decision regarding hospitalization.       Final diagnoses:  Viral URI    ED Discharge Orders          Ordered    benzonatate  (TESSALON ) 100 MG capsule  Every 8 hours        01/03/24 1052               Neysa Caron PARAS, DO 01/03/24 1543

## 2024-01-03 NOTE — ED Triage Notes (Signed)
 Reports heart rate of around 150s for the last 24 hrs. Only symptoms present is non productive cough.

## 2024-01-03 NOTE — Discharge Instructions (Addendum)
 Please try to maintain adequate hydration with frequent sips of water, low sugar Gatorade or clear liquids.  Please avoid taking Delsym in the future as I believe it may have contributed to your elevated heart rate.  You may take Tylenol  alternating with ibuprofen  with dosages as directed on the packaging.  You can also take Tessalon  Perles prescribed you for cough.  Return if you develop fevers, lightheadedness, passout, chest pain, shortness of breath, inability eat or drink due to nausea vomiting or any new or worsening symptoms that are concerning to you.

## 2024-01-08 LAB — CULTURE, BLOOD (ROUTINE X 2)
Culture: NO GROWTH
Culture: NO GROWTH
Special Requests: ADEQUATE
Special Requests: ADEQUATE

## 2024-01-16 ENCOUNTER — Ambulatory Visit: Admitting: Family Medicine

## 2024-01-18 ENCOUNTER — Ambulatory Visit
Admission: RE | Admit: 2024-01-18 | Discharge: 2024-01-18 | Disposition: A | Discharge status: Home / Self Care | Source: Ambulatory Visit | Attending: Family Medicine | Admitting: Family Medicine

## 2024-01-18 ENCOUNTER — Ambulatory Visit
Admission: RE | Admit: 2024-01-18 | Discharge: 2024-01-18 | Disposition: A | Discharge status: Home / Self Care | Attending: Family Medicine | Admitting: Family Medicine

## 2024-01-18 ENCOUNTER — Other Ambulatory Visit (HOSPITAL_BASED_OUTPATIENT_CLINIC_OR_DEPARTMENT_OTHER): Payer: Self-pay

## 2024-01-18 ENCOUNTER — Ambulatory Visit: Admitting: Family Medicine

## 2024-01-18 ENCOUNTER — Encounter: Payer: Self-pay | Admitting: Family Medicine

## 2024-01-18 VITALS — BP 118/64 | HR 83 | Ht 60.0 in | Wt 220.7 lb

## 2024-01-18 DIAGNOSIS — M5442 Lumbago with sciatica, left side: Secondary | ICD-10-CM | POA: Insufficient documentation

## 2024-01-18 DIAGNOSIS — E785 Hyperlipidemia, unspecified: Secondary | ICD-10-CM

## 2024-01-18 DIAGNOSIS — Z7985 Long-term (current) use of injectable non-insulin antidiabetic drugs: Secondary | ICD-10-CM

## 2024-01-18 DIAGNOSIS — G8929 Other chronic pain: Secondary | ICD-10-CM

## 2024-01-18 DIAGNOSIS — M5441 Lumbago with sciatica, right side: Secondary | ICD-10-CM | POA: Diagnosis present

## 2024-01-18 DIAGNOSIS — E1165 Type 2 diabetes mellitus with hyperglycemia: Secondary | ICD-10-CM | POA: Diagnosis not present

## 2024-01-18 DIAGNOSIS — K219 Gastro-esophageal reflux disease without esophagitis: Secondary | ICD-10-CM

## 2024-01-18 DIAGNOSIS — F32A Depression, unspecified: Secondary | ICD-10-CM

## 2024-01-18 DIAGNOSIS — E1169 Type 2 diabetes mellitus with other specified complication: Secondary | ICD-10-CM | POA: Diagnosis not present

## 2024-01-18 DIAGNOSIS — Z23 Encounter for immunization: Secondary | ICD-10-CM

## 2024-01-18 DIAGNOSIS — R7989 Other specified abnormal findings of blood chemistry: Secondary | ICD-10-CM

## 2024-01-18 DIAGNOSIS — F419 Anxiety disorder, unspecified: Secondary | ICD-10-CM

## 2024-01-18 MED ORDER — TIRZEPATIDE 5 MG/0.5ML ~~LOC~~ SOAJ
5.0000 mg | SUBCUTANEOUS | 0 refills | Status: DC
  Filled 2024-01-18 – 2024-02-22 (×4): qty 2, 28d supply, fill #0
  Filled 2024-03-23: qty 2, 28d supply, fill #1

## 2024-01-18 MED ORDER — FLUOXETINE HCL 20 MG PO CAPS
20.0000 mg | ORAL_CAPSULE | Freq: Every day | ORAL | 3 refills | Status: AC
  Filled 2024-01-18 – 2024-04-23 (×4): qty 90, 90d supply, fill #0

## 2024-01-18 MED ORDER — ATORVASTATIN CALCIUM 80 MG PO TABS
80.0000 mg | ORAL_TABLET | Freq: Every day | ORAL | 3 refills | Status: AC
  Filled 2024-01-18 – 2024-04-23 (×4): qty 90, 90d supply, fill #0

## 2024-01-18 MED ORDER — METFORMIN HCL 1000 MG PO TABS
1000.0000 mg | ORAL_TABLET | Freq: Every day | ORAL | 3 refills | Status: AC
  Filled 2024-01-18 – 2024-04-23 (×4): qty 90, 90d supply, fill #0

## 2024-01-18 MED ORDER — OMEPRAZOLE 40 MG PO CPDR
40.0000 mg | DELAYED_RELEASE_CAPSULE | Freq: Every day | ORAL | 3 refills | Status: AC
  Filled 2024-01-18 – 2024-04-23 (×4): qty 90, 90d supply, fill #0

## 2024-01-18 NOTE — Progress Notes (Signed)
 Established Patient Office Visit  Introduced to nurse practitioner role and practice setting.  All questions answered.  Discussed provider/patient relationship and expectations.  Subjective   Patient ID: Rhonda Cohen, female    DOB: 14-Feb-1996  Age: 28 y.o. MRN: [id number]  Chief Complaint  Patient presents with   Medical Management of Chronic Issues   Diabetes    Patient present for 31mo f/u DM. Believes she may need to increase her mounjaro     Tailbone Pain    Patient has been having tailbone pain for a while, States after she had her daughter is when it started     Discussed the use of AI scribe software for clinical note transcription with the patient, who gave verbal consent to proceed.  History of Present Illness Rhonda Cohen is a 28 year old female who presents for medication management and evaluation of tailbone pain.  She has been experiencing tailbone pain since February, which began shortly after the birth of her daughter. The pain is exacerbated by prolonged sitting, especially in certain chairs or vehicles, and is relieved upon standing. She describes the pain as excruciating when trying to stand up, but it dissipates almost immediately once she is upright. She has tried using a cushion with a hole to alleviate pressure, but it has not been effective. No numbness, tingling, or weakness in her lower extremities has been noted. Despite attempts at stretching and changing sitting positions, the pain has not improved.  She is currently taking Mounjaro  for diabetes management but has run out of her current supply. 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 She is also taking Prozac  20 mg and Prilosec for gastric reflux. She has not been taking metformin  recently. Her diabetes was diagnosed a couple of years ago, with A1C levels consistently below 9, the most recent being 8.8. She has not had her  eyes screened recently.  She mentions a past thyroid issue with elevated levels, but she has never been on medication for it. She recalls a previous thyroid level of 42 and expresses concern about potential weight loss associated with thyroid medication. She never repeated TSH as request.       01/18/2024    8:16 AM  Depression screen PHQ 2/9  Decreased Interest 0  Down, Depressed, Hopeless 0  PHQ - 2 Score 0  Altered sleeping 0  Tired, decreased energy 0  Change in appetite 0  Feeling bad or failure about yourself  0  Trouble concentrating 0  Moving slowly or fidgety/restless 0  Suicidal thoughts 0  PHQ-9 Score 0  Difficult doing work/chores Not difficult at all       01/18/2024    8:17 AM  GAD 7 : Generalized Anxiety Score  Nervous, Anxious, on Edge 0  Control/stop worrying 0  Worry too much - different things 0  Trouble relaxing 0  Restless 0  Easily annoyed or irritable 0  Afraid - awful might happen 0  Total GAD 7 Score 0  Anxiety Difficulty Not difficult at all     ROS  Negative unless indicated in HPI   Objective:     BP 118/64 (BP Location: Right Arm, Patient Position: Sitting, Cuff Size: Normal) Comment: manual  Pulse 83   Ht 5' (1.524 m)   Wt 220 lb 11.2 oz (100.1 kg)   SpO2 97%   Breastfeeding No   BMI 43.10 kg/m    Physical Exam Constitutional:  General: She is not in acute distress.    Appearance: Normal appearance. She is obese. She is not ill-appearing, toxic-appearing or diaphoretic.  HENT:     Head: Normocephalic.     Nose: Nose normal.     Mouth/Throat:     Mouth: Mucous membranes are moist.     Pharynx: Oropharynx is clear.  Eyes:     Extraocular Movements: Extraocular movements intact.     Pupils: Pupils are equal, round, and reactive to light.  Cardiovascular:     Rate and Rhythm: Normal rate and regular rhythm.     Pulses: Normal pulses.          Dorsalis pedis pulses are 2+ on the right side and 2+ on the left side.        Posterior tibial pulses are 2+ on the right side and 2+ on the left side.     Heart sounds: Normal heart sounds. No murmur heard.    No friction rub. No gallop.  Pulmonary:     Effort: No respiratory distress.     Breath sounds: No stridor. No wheezing, rhonchi or rales.  Chest:     Chest wall: No tenderness.  Musculoskeletal:     Right lower leg: No edema.     Left lower leg: No edema.     Right foot: Normal range of motion. No deformity, bunion, Charcot foot, foot drop or prominent metatarsal heads.     Left foot: Normal range of motion. No deformity, bunion, Charcot foot, foot drop or prominent metatarsal heads.  Feet:     Right foot:     Protective Sensation: 10 sites tested.  10 sites sensed.     Skin integrity: Skin integrity normal.     Toenail Condition: Right toenails are normal.     Left foot:     Protective Sensation: 10 sites tested.  10 sites sensed.     Skin integrity: Skin integrity normal.     Toenail Condition: Left toenails are normal.  Skin:    General: Skin is warm and dry.     Capillary Refill: Capillary refill takes less than 2 seconds.  Neurological:     General: No focal deficit present.     Mental Status: She is alert and oriented to person, place, and time. Mental status is at baseline.     Cranial Nerves: No cranial nerve deficit.     Motor: No weakness.     Gait: Gait normal.  Psychiatric:        Mood and Affect: Mood normal.        Behavior: Behavior normal.        Thought Content: Thought content normal.        Judgment: Judgment normal.      No results found for any visits on 01/18/24.    The ASCVD Risk score (Arnett DK, et al., 2019) failed to calculate for the following reasons:   The 2019 ASCVD risk score is only valid for ages 21 to 48    Assessment & Plan:  Type 2 diabetes mellitus with hyperglycemia, without long-term current use of insulin  (HCC) -     HM Diabetes Foot Exam -     Comprehensive metabolic panel with GFR -      TSH + free T4 -     Lipid panel -     Tirzepatide ; Inject 5 mg into the skin once a week.  Dispense: 6 mL; Refill: 0 -     metFORMIN   HCl; Take 1 tablet (1,000 mg total) by mouth daily with supper.  Dispense: 90 tablet; Refill: 3  Hyperlipidemia associated with type 2 diabetes mellitus (HCC) -     Atorvastatin  Calcium ; Take 1 tablet (80 mg total) by mouth daily.  Dispense: 90 tablet; Refill: 3  Elevated TSH -     Hemoglobin A1c  Chronic bilateral low back pain with bilateral sciatica -     Ambulatory referral to Physical Therapy -     DG Lumbar Spine Complete; Future -     DG Sacrum/Coccyx; Future  Immunization due -     Flu vaccine trivalent PF, 6mos and older(Flulaval,Afluria,Fluarix,Fluzone)  Gastroesophageal reflux disease without esophagitis -     Omeprazole ; Take 1 capsule (40 mg total) by mouth daily.  Dispense: 90 capsule; Refill: 3  Anxiety and depression -     FLUoxetine  HCl; Take 1 capsule (20 mg total) by mouth daily.  Dispense: 90 capsule; Refill: 3     Assessment and Plan Assessment & Plan Type 2 diabetes mellitus with hyperglycemia Type 2 diabetes mellitus with hyperglycemia, managed with Mounjaro  and previously prescribed metformin , which she has not been taking. Last A1C was 8.8. No issues with foot sensation or healing. Importance of eye screening for diabetes management discussed. - Increase Mounjaro  dosage to 0.5mg  weekl, okay to increase to 7,5mg  in one month if tolerated - Continue metformin  - foot exam today -WNL - Order A1C check - check lipid - on statin - Discuss importance of eye screening for diabetes management  Elevated thyroid stimulating hormone (suspected hypothyroidism) Elevated thyroid stimulating hormone, suspecting hypothyroidism. She has not been on thyroid medication before. Discussed potential need for thyroid medication if TSH remains elevated to prevent long-term complications. - Order thyroid function tests, previous 42.800 - Discuss  potential need for thyroid medication if TSH remains elevated  Chronic low back pain - feels like pinched nerve, especially near coccyx - exacerbated by prolonged sitting, relieved upon standing. Pain started after childbirth and has persisted since February. No numbness or tingling. Differential includes pinched nerve or skeletal abnormalities. Discussed potential referral to orthopedics if imaging shows abnormalities. - Order lumbar spine and pelvis x-ray - Refer to physical therapy for evaluation and management - Advise use of Tylenol  or ibuprofen  for pain management - back strengthening exercise recommended  Gastroesophageal reflux disease (GERD) GERD managed with Prilosec (omeprazole ).  Hyperlipidemia Hyperlipidemia managed with atorvastatin . Issues with pharmacy refill due to insurance requirements. - Send atorvastatin  prescription to [facility]  Anxiety and Depression Depression managed with Prozac  20 mg, well tolerated. - Continue Prozac  20 mg  General Health Maintenance Discussed flu vaccination and foot exam as part of diabetes management. Discussed safety of pedicures for diabetics with intact sensation and no wounds. - Administer flu vaccine - Discuss safety precautions for pedicures in diabetics   Return in about 3 months (around 04/18/2024) for DMII.   I, Curtis DELENA Boom, FNP, have reviewed all documentation for this visit. The documentation on 01/18/24 for the exam, diagnosis, procedures, and orders are all accurate and complete.   Curtis DELENA Boom, FNP

## 2024-01-19 LAB — COMPREHENSIVE METABOLIC PANEL WITH GFR
ALT: 39 IU/L — ABNORMAL HIGH (ref 0–32)
AST: 24 IU/L (ref 0–40)
Albumin: 4.3 g/dL (ref 4.0–5.0)
Alkaline Phosphatase: 84 IU/L (ref 41–116)
BUN/Creatinine Ratio: 19 (ref 9–23)
BUN: 13 mg/dL (ref 6–20)
Bilirubin Total: <0.2 mg/dL (ref 0.0–1.2)
CO2: 22 mmol/L (ref 20–29)
Calcium: 9.4 mg/dL (ref 8.7–10.2)
Chloride: 101 mmol/L (ref 96–106)
Creatinine, Ser: 0.67 mg/dL (ref 0.57–1.00)
Globulin, Total: 2.9 g/dL (ref 1.5–4.5)
Glucose: 198 mg/dL — ABNORMAL HIGH (ref 70–99)
Potassium: 4.5 mmol/L (ref 3.5–5.2)
Sodium: 137 mmol/L (ref 134–144)
Total Protein: 7.2 g/dL (ref 6.0–8.5)
eGFR: 122 mL/min/1.73 (ref >59)

## 2024-01-19 LAB — HEMOGLOBIN A1C
Est. average glucose Bld gHb Est-mCnc: 203 mg/dL
Hgb A1c MFr Bld: 8.7 % — ABNORMAL HIGH (ref 4.8–5.6)

## 2024-01-19 LAB — LIPID PANEL
Chol/HDL Ratio: 5.2 ratio — ABNORMAL HIGH (ref 0.0–4.4)
Cholesterol, Total: 194 mg/dL (ref 100–199)
HDL: 37 mg/dL — ABNORMAL LOW (ref >39)
LDL Chol Calc (NIH): 122 mg/dL — ABNORMAL HIGH (ref 0–99)
Triglycerides: 198 mg/dL — ABNORMAL HIGH (ref 0–149)
VLDL Cholesterol Cal: 35 mg/dL (ref 5–40)

## 2024-01-19 LAB — TSH+FREE T4
Free T4: 0.99 ng/dL (ref 0.82–1.77)
TSH: 5.03 u[IU]/mL — ABNORMAL HIGH (ref 0.450–4.500)

## 2024-01-20 ENCOUNTER — Ambulatory Visit: Payer: Self-pay | Admitting: Family Medicine

## 2024-01-21 ENCOUNTER — Other Ambulatory Visit: Payer: Self-pay | Admitting: Family Medicine

## 2024-01-21 DIAGNOSIS — G8929 Other chronic pain: Secondary | ICD-10-CM

## 2024-01-21 NOTE — Telephone Encounter (Signed)
 Referral to Physical therapy placed

## 2024-01-28 ENCOUNTER — Other Ambulatory Visit (HOSPITAL_BASED_OUTPATIENT_CLINIC_OR_DEPARTMENT_OTHER): Payer: Self-pay

## 2024-01-29 ENCOUNTER — Other Ambulatory Visit (HOSPITAL_BASED_OUTPATIENT_CLINIC_OR_DEPARTMENT_OTHER): Payer: Self-pay

## 2024-01-29 ENCOUNTER — Other Ambulatory Visit (HOSPITAL_COMMUNITY): Payer: Self-pay

## 2024-02-08 ENCOUNTER — Encounter: Payer: Self-pay | Admitting: Family Medicine

## 2024-02-09 ENCOUNTER — Emergency Department (HOSPITAL_COMMUNITY)
Admission: EM | Admit: 2024-02-09 | Discharge: 2024-02-09 | Disposition: A | Discharge status: Home / Self Care | Attending: Emergency Medicine | Admitting: Emergency Medicine

## 2024-02-09 ENCOUNTER — Emergency Department (HOSPITAL_COMMUNITY)

## 2024-02-09 ENCOUNTER — Encounter (HOSPITAL_COMMUNITY): Payer: Self-pay

## 2024-02-09 ENCOUNTER — Other Ambulatory Visit: Payer: Self-pay

## 2024-02-09 DIAGNOSIS — N132 Hydronephrosis with renal and ureteral calculous obstruction: Secondary | ICD-10-CM | POA: Insufficient documentation

## 2024-02-09 DIAGNOSIS — D72829 Elevated white blood cell count, unspecified: Secondary | ICD-10-CM | POA: Insufficient documentation

## 2024-02-09 DIAGNOSIS — N201 Calculus of ureter: Secondary | ICD-10-CM

## 2024-02-09 DIAGNOSIS — R10A2 Flank pain, left side: Secondary | ICD-10-CM | POA: Diagnosis present

## 2024-02-09 LAB — URINALYSIS, ROUTINE W REFLEX MICROSCOPIC
Bacteria, UA: NONE SEEN
Bilirubin Urine: NEGATIVE
Glucose, UA: NEGATIVE mg/dL
Ketones, ur: NEGATIVE mg/dL
Leukocytes,Ua: NEGATIVE
Nitrite: NEGATIVE
Protein, ur: 30 mg/dL — AB
RBC / HPF: >50 RBC/hpf (ref 0–5)
Specific Gravity, Urine: 1.025 (ref 1.005–1.030)
pH: 5 (ref 5.0–8.0)

## 2024-02-09 LAB — BASIC METABOLIC PANEL WITH GFR
Anion gap: 12 (ref 5–15)
BUN: 11 mg/dL (ref 6–20)
CO2: 22 mmol/L (ref 22–32)
Calcium: 8.9 mg/dL (ref 8.9–10.3)
Chloride: 104 mmol/L (ref 98–111)
Creatinine, Ser: 0.76 mg/dL (ref 0.44–1.00)
GFR, Estimated: >60 mL/min (ref >60)
Glucose, Bld: 151 mg/dL — ABNORMAL HIGH (ref 70–99)
Potassium: 3.7 mmol/L (ref 3.5–5.1)
Sodium: 138 mmol/L (ref 135–145)

## 2024-02-09 LAB — CBC WITH DIFFERENTIAL/PLATELET
Abs Immature Granulocytes: 0.04 K/uL (ref 0.00–0.07)
Basophils Absolute: 0 K/uL (ref 0.0–0.1)
Basophils Relative: 0 %
Eosinophils Absolute: 0.2 K/uL (ref 0.0–0.5)
Eosinophils Relative: 2 %
HCT: 40.2 % (ref 36.0–46.0)
Hemoglobin: 13.1 g/dL (ref 12.0–15.0)
Immature Granulocytes: 0 %
Lymphocytes Relative: 40 %
Lymphs Abs: 5 K/uL — ABNORMAL HIGH (ref 0.7–4.0)
MCH: 25.8 pg — ABNORMAL LOW (ref 26.0–34.0)
MCHC: 32.6 g/dL (ref 30.0–36.0)
MCV: 79.1 fL — ABNORMAL LOW (ref 80.0–100.0)
Monocytes Absolute: 0.8 K/uL (ref 0.1–1.0)
Monocytes Relative: 6 %
Neutro Abs: 6.5 K/uL (ref 1.7–7.7)
Neutrophils Relative %: 52 %
Platelets: 491 K/uL — ABNORMAL HIGH (ref 150–400)
RBC: 5.08 MIL/uL (ref 3.87–5.11)
RDW: 14.2 % (ref 11.5–15.5)
WBC: 12.6 K/uL — ABNORMAL HIGH (ref 4.0–10.5)
nRBC: 0 % (ref 0.0–0.2)

## 2024-02-09 LAB — POC URINE PREG, ED: Preg Test, Ur: NEGATIVE

## 2024-02-09 LAB — HCG, QUANTITATIVE, PREGNANCY: hCG, Beta Chain, Quant, S: <1 m[IU]/mL (ref <5)

## 2024-02-09 MED ORDER — HYDROMORPHONE HCL 1 MG/ML IJ SOLN
1.0000 mg | Freq: Once | INTRAMUSCULAR | Status: AC
  Administered 2024-02-09: 1 mg via INTRAVENOUS
  Filled 2024-02-09: qty 1

## 2024-02-09 MED ORDER — TAMSULOSIN HCL 0.4 MG PO CAPS: 0.4000 mg | ORAL_CAPSULE | Freq: Every day | ORAL | 0 refills | Status: DC

## 2024-02-09 MED ORDER — HYDROCODONE-ACETAMINOPHEN 5-325 MG PO TABS: 1.0000 | ORAL_TABLET | Freq: Four times a day (QID) | ORAL | 0 refills | Status: DC | PRN

## 2024-02-09 MED ORDER — SODIUM CHLORIDE 0.9 % IV BOLUS
1000.0000 mL | Freq: Once | INTRAVENOUS | Status: AC
  Administered 2024-02-09: 1000 mL via INTRAVENOUS

## 2024-02-09 MED ORDER — KETOROLAC TROMETHAMINE 30 MG/ML IJ SOLN
15.0000 mg | Freq: Once | INTRAMUSCULAR | Status: AC
  Administered 2024-02-09: 15 mg via INTRAVENOUS
  Filled 2024-02-09: qty 1

## 2024-02-09 MED ORDER — MORPHINE SULFATE (PF) 4 MG/ML IV SOLN
4.0000 mg | Freq: Once | INTRAVENOUS | Status: AC
  Administered 2024-02-09: 4 mg via INTRAVENOUS
  Filled 2024-02-09: qty 1

## 2024-02-09 MED ORDER — NAPROXEN 500 MG PO TABS: 500.0000 mg | ORAL_TABLET | Freq: Two times a day (BID) | ORAL | 0 refills | Status: DC

## 2024-02-09 MED ORDER — ONDANSETRON HCL 4 MG/2ML IJ SOLN
4.0000 mg | Freq: Once | INTRAMUSCULAR | Status: AC
  Administered 2024-02-09: 4 mg via INTRAVENOUS
  Filled 2024-02-09: qty 2

## 2024-02-09 NOTE — ED Notes (Signed)
 Called lab regarding hcg

## 2024-02-09 NOTE — ED Notes (Signed)
 Returned from ct

## 2024-02-09 NOTE — ED Notes (Signed)
 ED Provider at bedside.

## 2024-02-09 NOTE — ED Triage Notes (Signed)
 Pov from home cc of left flank pain that started at 2 am. Feels like a kidney stone.  Took 1g tylenol  at 3am

## 2024-02-09 NOTE — ED Notes (Signed)
 Went over [employer] gave strainer All questions answered  Ambulatory to lobby

## 2024-02-09 NOTE — ED Provider Notes (Signed)
 [place] [facility] AT [facility]  Provider Note  CSN: [id number] Arrival date & time: 02/09/24 9662  History Chief Complaint  Patient presents with   Flank Pain    Rhonda Cohen is a 28 y.o. female with history of renal stones reports sudden onset of L flank pain about 2 hours ago with nausea but no vomiting, similar to prior stones. No fever or dysuria. Took APAP PTA.    Home Medications Prior to Admission medications   Medication Sig Start Date End Date Taking? Authorizing Provider  HYDROcodone-acetaminophen  (NORCO/VICODIN) 5-325 MG tablet Take 1 tablet by mouth every 6 (six) hours as needed for severe pain (pain score 7-10). 02/09/24  Yes Roselyn Carlin NOVAK, MD  naproxen  (NAPROSYN ) 500 MG tablet Take 1 tablet (500 mg total) by mouth 2 (two) times daily. 02/09/24  Yes Roselyn Carlin NOVAK, MD  tamsulosin  (FLOMAX ) 0.4 MG CAPS capsule Take 1 capsule (0.4 mg total) by mouth daily. 02/09/24  Yes Roselyn Carlin NOVAK, MD  acetaminophen  (TYLENOL ) 500 MG tablet Take 2 tablets (1,000 mg total) by mouth every 6 (six) hours as needed. 04/01/23   Diedre Rosaline BRAVO, MD  atorvastatin  (LIPITOR) 80 MG tablet Take 1 tablet (80 mg total) by mouth daily. 01/18/24   Wellington Curtis LABOR, FNP  benzonatate  (TESSALON ) 100 MG capsule Take 1 capsule (100 mg total) by mouth every 8 (eight) hours. 01/03/24   Neysa Caron PARAS, DO  FLUoxetine  (PROZAC ) 20 MG capsule Take 1 capsule (20 mg total) by mouth daily. 01/18/24   Wellington Curtis LABOR, FNP  ibuprofen  (ADVIL ) 600 MG tablet Take 1 tablet (600 mg total) by mouth every 6 (six) hours as needed. 04/01/23   Diedre Rosaline BRAVO, MD  metFORMIN  (GLUCOPHAGE ) 1000 MG tablet Take 1 tablet (1,000 mg total) by mouth daily with supper. 01/18/24   Wellington Curtis LABOR, FNP  omeprazole  (PRILOSEC) 40 MG capsule Take 1 capsule (40 mg total) by mouth daily. 01/18/24   Wellington Curtis LABOR, FNP  tirzepatide  (MOUNJARO ) 5 MG/0.5ML Pen Inject 5 mg into the skin once a week.  01/18/24   Wellington Curtis LABOR, FNP     Allergies    Sulfa antibiotics   Review of Systems   Review of Systems Please see HPI for pertinent positives and negatives  Physical Exam BP (!) 142/91   Pulse 89   Temp (!) 97.5 F (36.4 C)   Resp 20   Ht 5' (1.524 m)   Wt 95.3 kg   LMP 02/07/2024   SpO2 96%   BMI 41.01 kg/m   Physical Exam Vitals and nursing note reviewed.  Constitutional:      Appearance: Normal appearance.  HENT:     Head: Normocephalic and atraumatic.     Nose: Nose normal.     Mouth/Throat:     Mouth: Mucous membranes are moist.  Eyes:     Extraocular Movements: Extraocular movements intact.     Conjunctiva/sclera: Conjunctivae normal.  Cardiovascular:     Rate and Rhythm: Normal rate.  Pulmonary:     Effort: Pulmonary effort is normal.     Breath sounds: Normal breath sounds.  Abdominal:     General: Abdomen is flat.     Palpations: Abdomen is soft.     Tenderness: There is no abdominal tenderness.  Musculoskeletal:        General: No swelling. Normal range of motion.     Cervical back: Neck supple.  Skin:    General: Skin is warm and  dry.  Neurological:     General: No focal deficit present.     Mental Status: She is alert.  Psychiatric:        Mood and Affect: Mood normal.     ED Results / Procedures / Treatments   EKG None  Procedures Procedures  Medications Ordered in the ED Medications  morphine  (PF) 4 MG/ML injection 4 mg (4 mg Intravenous Given 02/09/24 0403)  ondansetron  (ZOFRAN ) injection 4 mg (4 mg Intravenous Given 02/09/24 0403)  sodium chloride  0.9 % bolus 1,000 mL (1,000 mLs Intravenous New Bag/Given 02/09/24 0408)  HYDROmorphone (DILAUDID) injection 1 mg (1 mg Intravenous Given 02/09/24 0449)  ketorolac  (TORADOL ) 30 MG/ML injection 15 mg (15 mg Intravenous Given 02/09/24 0554)    Initial Impression and Plan  Patient here with symptoms most consistent with renal colic. Will check labs, send for CT and give  pain/nausea meds for comfort.   ED Course   Clinical Course as of 02/09/24 0623  Sat Feb 09, 2024  0445 CBC with mild leukocytosis.  [CS]  0452 BMP is unremarkable.  [CS]  0542 UA with blood but no infection.  [CS]  0610 I personally viewed the images from radiology studies and agree with radiologist interpretation: CT shows a small proximal ureteral stone. Toradol  ordered for additional pain control.  [CS]  9378 Patient feeling better, pain is controlled. Plan discharge with urine strainer, Rx for Naprosyn , Norco, Flomax . Urology follow up or RTED if not improving.  [CS]    Clinical Course User Index [CS] Roselyn Carlin NOVAK, MD     MDM Rules/Calculators/A&P Medical Decision Making Problems Addressed: Ureterolithiasis: acute illness or injury  Amount and/or Complexity of Data Reviewed Labs: ordered. Decision-making details documented in ED Course. Radiology: ordered and independent interpretation performed. Decision-making details documented in ED Course.  Risk Prescription drug management. Parenteral controlled substances.     Final Clinical Impression(s) / ED Diagnoses Final diagnoses:  Ureterolithiasis    Rx / DC Orders ED Discharge Orders          Ordered    naproxen  (NAPROSYN ) 500 MG tablet  2 times daily        02/09/24 0623    HYDROcodone-acetaminophen  (NORCO/VICODIN) 5-325 MG tablet  Every 6 hours PRN        02/09/24 0623    tamsulosin  (FLOMAX ) 0.4 MG CAPS capsule  Daily        02/09/24 0623             Roselyn Carlin NOVAK, MD 02/09/24 [phone]

## 2024-02-15 ENCOUNTER — Other Ambulatory Visit: Payer: Self-pay | Admitting: Family Medicine

## 2024-02-15 DIAGNOSIS — R110 Nausea: Secondary | ICD-10-CM

## 2024-02-15 MED ORDER — ONDANSETRON HCL 4 MG PO TABS: 4.0000 mg | ORAL_TABLET | Freq: Two times a day (BID) | ORAL | 0 refills | Status: DC | PRN

## 2024-02-23 ENCOUNTER — Other Ambulatory Visit (HOSPITAL_COMMUNITY): Payer: Self-pay

## 2024-02-27 ENCOUNTER — Other Ambulatory Visit: Payer: Self-pay

## 2024-02-27 ENCOUNTER — Other Ambulatory Visit (HOSPITAL_COMMUNITY): Payer: Self-pay

## 2024-02-29 ENCOUNTER — Other Ambulatory Visit (HOSPITAL_COMMUNITY): Payer: Self-pay

## 2024-02-29 ENCOUNTER — Other Ambulatory Visit: Payer: Self-pay

## 2024-03-23 ENCOUNTER — Other Ambulatory Visit (HOSPITAL_COMMUNITY): Payer: Self-pay

## 2024-04-23 ENCOUNTER — Other Ambulatory Visit (HOSPITAL_COMMUNITY): Payer: Self-pay

## 2024-04-25 ENCOUNTER — Telehealth: Payer: Self-pay

## 2024-04-25 NOTE — Telephone Encounter (Signed)
 Copied from CRM [id number]. Topic: Clinical - Request for Lab/Test Order >> Apr 23, 2024  1:10 PM Willma R wrote: Reason for CRM: Patient has been getting lab work done every 3 months, is unable to get scheduled until March. Is requesting orders for her to come get her lab work done this month since she is unable to be seen sooner.  Patient can be reached at [phone]

## 2024-04-25 NOTE — Telephone Encounter (Signed)
 LVM per DPR. Appt rescheduled for 05/02/23 @ 10:40 am  E2C2- OK to advise appt rescheduled to 05/01/24 at 10:40 am per provider so that labs and medication can be addressed. If she would like to have lab work completed before visit she can have them done 3 days before scheduled appointment and she would need to be fasting. Please let us  know so that orders can be placed. If date or time does not work ok to take newborn slot per provider so that patient is seen before March

## 2024-04-25 NOTE — Telephone Encounter (Signed)
 Copied from CRM [id number]. Topic: Clinical - Medication Question >> Apr 23, 2024  1:12 PM Rachelle R wrote: Reason for CRM: Patient would like to see if she is able to get her dosage to the next level for her tirzepatide  (MOUNJARO ) 5 MG/0.5ML Pen. Unable to schedule an appointment until March.  Patient can be reached at [phone]

## 2024-05-01 ENCOUNTER — Other Ambulatory Visit (HOSPITAL_COMMUNITY): Payer: Self-pay

## 2024-05-01 ENCOUNTER — Ambulatory Visit: Admitting: Family Medicine

## 2024-05-01 ENCOUNTER — Other Ambulatory Visit: Payer: Self-pay

## 2024-05-01 ENCOUNTER — Encounter: Payer: Self-pay | Admitting: Family Medicine

## 2024-05-01 VITALS — BP 108/74 | HR 82 | Ht 60.0 in | Wt 215.7 lb

## 2024-05-01 DIAGNOSIS — Z7985 Long-term (current) use of injectable non-insulin antidiabetic drugs: Secondary | ICD-10-CM | POA: Diagnosis not present

## 2024-05-01 DIAGNOSIS — E66813 Obesity, class 3: Secondary | ICD-10-CM

## 2024-05-01 DIAGNOSIS — E1169 Type 2 diabetes mellitus with other specified complication: Secondary | ICD-10-CM | POA: Diagnosis not present

## 2024-05-01 DIAGNOSIS — F419 Anxiety disorder, unspecified: Secondary | ICD-10-CM | POA: Diagnosis not present

## 2024-05-01 DIAGNOSIS — Z6841 Body Mass Index (BMI) 40.0 and over, adult: Secondary | ICD-10-CM

## 2024-05-01 DIAGNOSIS — Z8759 Personal history of other complications of pregnancy, childbirth and the puerperium: Secondary | ICD-10-CM

## 2024-05-01 DIAGNOSIS — R7989 Other specified abnormal findings of blood chemistry: Secondary | ICD-10-CM

## 2024-05-01 DIAGNOSIS — K219 Gastro-esophageal reflux disease without esophagitis: Secondary | ICD-10-CM | POA: Diagnosis not present

## 2024-05-01 DIAGNOSIS — R112 Nausea with vomiting, unspecified: Secondary | ICD-10-CM | POA: Diagnosis not present

## 2024-05-01 DIAGNOSIS — E282 Polycystic ovarian syndrome: Secondary | ICD-10-CM | POA: Diagnosis not present

## 2024-05-01 DIAGNOSIS — E785 Hyperlipidemia, unspecified: Secondary | ICD-10-CM

## 2024-05-01 DIAGNOSIS — E8889 Other specified metabolic disorders: Secondary | ICD-10-CM | POA: Diagnosis not present

## 2024-05-01 DIAGNOSIS — E1165 Type 2 diabetes mellitus with hyperglycemia: Secondary | ICD-10-CM

## 2024-05-01 MED ORDER — TIRZEPATIDE 7.5 MG/0.5ML ~~LOC~~ SOAJ
7.5000 mg | SUBCUTANEOUS | 2 refills | Status: AC
  Filled 2024-05-01 – 2024-05-15 (×3): qty 2, 28d supply, fill #0

## 2024-05-01 MED ORDER — ONDANSETRON 4 MG PO TBDP
4.0000 mg | ORAL_TABLET | Freq: Three times a day (TID) | ORAL | 0 refills | Status: AC | PRN
  Filled 2024-05-01 – 2024-05-15 (×2): qty 20, 7d supply, fill #0

## 2024-05-01 NOTE — Progress Notes (Signed)
 "  Established Patient Office Visit  Patient ID: Rhonda Cohen, female    DOB: 06-18-1995  Age: 29 y.o. MRN: [id number] PCP: Sharma Coyer, MD  Chief Complaint  Patient presents with   Medical Management of Chronic Issues    Patient is doing well overall, would like to discuss increasing mounjaro , discuss ref to psy for adhd testing     Subjective:     HPI  Discussed the use of AI scribe software for clinical note transcription with the patient, who gave verbal consent to proceed.  History of Present Illness Rhonda Cohen is a 29 year old female with chronic type 2 diabetes who presents to transfer care and for diabetes management.  She has chronic type 2 diabetes and is currently on metformin  1000 mg daily and tirzepatide  5 mg weekly, which is being increased to 7.5 mg weekly. She experiences nausea and vomiting once or twice a week, particularly when taking metformin  too soon after her tirzepatide  injection. She does not regularly check her blood sugar but noted a reading of 100 mg/dL after feeling sick and vomiting early in the morning. She also has PCOS and takes metformin  for this condition.  She has class 3 obesity with a BMI of 42 and is actively trying to lose weight. She has started incorporating more physical activity into her routine, aiming to walk or run a mile during her work shifts, and is cutting back on sodas, opting for zero sugar options. She is also trying to switch from regular pasta to protein pasta. She does not currently count calories but is considering tracking her intake to stay within an 1800 calorie per day range.  She has a history of anxiety and depression and is currently taking Prozac  20 mg daily. She also has a history of gestational hypertension and is on atorvastatin  80 mg daily for hyperlipidemia. Her blood pressure was measured at 108/74 mmHg.  She has GERD and is taking omeprazole  40 mg daily. She reports a history of abnormal TSH  levels, with a previous reading of 42, which later normalized to 4.5. She has not had a thyroid check recently.  She has a family history of ovarian and cervical cancer, which causes her concern about her own risk. She has not had a Pap smear since her post-pregnancy check-up and has only had one in her life, which was during pregnancy.    Patient Active Problem List   Diagnosis Date Noted   Type 2 diabetes mellitus with hyperglycemia, without long-term current use of insulin  (HCC) 10/10/2023   Class 3 severe obesity due to excess calories without serious comorbidity with body mass index (BMI) of 40.0 to 44.9 in adult (HCC) 10/10/2023   History of gestational hypertension 10/10/2023   PCOS (polycystic ovarian syndrome) 10/10/2023   Anxiety and depression 10/10/2023   Gastroesophageal reflux disease without esophagitis 10/10/2023   Outpatient Encounter Medications as of 05/01/2024  Medication Sig   atorvastatin  (LIPITOR) 80 MG tablet Take 1 tablet (80 mg total) by mouth daily.   FLUoxetine  (PROZAC ) 20 MG capsule Take 1 capsule (20 mg total) by mouth daily.   metFORMIN  (GLUCOPHAGE ) 1000 MG tablet Take 1 tablet (1,000 mg total) by mouth daily with supper.   omeprazole  (PRILOSEC) 40 MG capsule Take 1 capsule (40 mg total) by mouth daily.   ondansetron  (ZOFRAN -ODT) 4 MG disintegrating tablet Take 1 tablet (4 mg total) by mouth every 8 (eight) hours as needed.   tirzepatide  (MOUNJARO ) 7.5 MG/0.5ML Pen Inject 7.5 mg into  the skin once a week.   acetaminophen  (TYLENOL ) 500 MG tablet Take 2 tablets (1,000 mg total) by mouth every 6 (six) hours as needed.   [DISCONTINUED] benzonatate  (TESSALON ) 100 MG capsule Take 1 capsule (100 mg total) by mouth every 8 (eight) hours.   [DISCONTINUED] HYDROcodone -acetaminophen  (NORCO/VICODIN) 5-325 MG tablet Take 1 tablet by mouth every 6 (six) hours as needed for severe pain (pain score 7-10).   [DISCONTINUED] ibuprofen  (ADVIL ) 600 MG tablet Take 1 tablet (600 mg  total) by mouth every 6 (six) hours as needed.   [DISCONTINUED] naproxen  (NAPROSYN ) 500 MG tablet Take 1 tablet (500 mg total) by mouth 2 (two) times daily.   [DISCONTINUED] ondansetron  (ZOFRAN ) 4 MG tablet Take 1 tablet (4 mg total) by mouth every 12 (twelve) hours as needed for nausea or vomiting. (Patient not taking: Reported on 05/01/2024)   [DISCONTINUED] tamsulosin  (FLOMAX ) 0.4 MG CAPS capsule Take 1 capsule (0.4 mg total) by mouth daily.   [DISCONTINUED] tirzepatide  (MOUNJARO ) 5 MG/0.5ML Pen Inject 5 mg into the skin once a week.   No facility-administered encounter medications on file as of 05/01/2024.     ROS    Objective:     BP 108/74 (BP Location: Left Arm, Patient Position: Sitting, Cuff Size: Normal)   Pulse 82   Ht 5' (1.524 m)   Wt 215 lb 11.2 oz (97.8 kg)   LMP 04/12/2024 (Exact Date)   SpO2 98%   Breastfeeding No   BMI 42.13 kg/m  BP Readings from Last 3 Encounters:  05/01/24 108/74  02/09/24 (!) 142/91  01/18/24 118/64   Wt Readings from Last 3 Encounters:  05/01/24 215 lb 11.2 oz (97.8 kg)  02/09/24 210 lb (95.3 kg)  01/18/24 220 lb 11.2 oz (100.1 kg)      Physical Exam Vitals reviewed.  Constitutional:      General: She is not in acute distress.    Appearance: Normal appearance. She is not ill-appearing.  Cardiovascular:     Rate and Rhythm: Normal rate and regular rhythm.  Pulmonary:     Effort: Pulmonary effort is normal. No respiratory distress.     Breath sounds: No wheezing, rhonchi or rales.  Neurological:     Mental Status: She is alert and oriented to person, place, and time.  Psychiatric:        Mood and Affect: Mood normal.        Behavior: Behavior normal.      No results found for any visits on 05/01/24.  Last metabolic panel Lab Results  Component Value Date   GLUCOSE 151 (H) 02/09/2024   NA 138 02/09/2024   K 3.7 02/09/2024   CL 104 02/09/2024   CO2 22 02/09/2024   BUN 11 02/09/2024   CREATININE 0.76 02/09/2024    GFRNONAA >60 02/09/2024   CALCIUM  8.9 02/09/2024   PROT 7.2 01/18/2024   ALBUMIN  4.3 01/18/2024   LABGLOB 2.9 01/18/2024   BILITOT <0.2 01/18/2024   ALKPHOS 84 01/18/2024   AST 24 01/18/2024   ALT 39 (H) 01/18/2024   ANIONGAP 12 02/09/2024   Last lipids Lab Results  Component Value Date   CHOL 194 01/18/2024   HDL 37 (L) 01/18/2024   LDLCALC 122 (H) 01/18/2024   TRIG 198 (H) 01/18/2024   CHOLHDL 5.2 (H) 01/18/2024    Last hemoglobin A1c Lab Results  Component Value Date   HGBA1C 8.7 (H) 01/18/2024   Last thyroid functions Lab Results  Component Value Date   TSH 5.030 (H)  01/18/2024   FREET4 0.99 01/18/2024      The ASCVD Risk score (Arnett DK, et al., 2019) failed to calculate for the following reasons:   The 2019 ASCVD risk score is only valid for ages 15 to 64   * - Cholesterol units were assumed    Assessment & Plan:   Problem List Items Addressed This Visit     Anxiety and depression    Anxiety and depression Chronic,stable  Managed with Prozac  20 mg daily. Reports doing well overall. - Continue Prozac  20 mg daily      Relevant Orders   Ambulatory referral to Psychiatry   Class 3 severe obesity due to excess calories without serious comorbidity with body mass index (BMI) of 40.0 to 44.9 in adult [facility])   Class 3 obesity Chronic  BMI of 42. Reports difficulty losing weight despite dietary changes and increased physical activity. Discussed the role of tirzepatide  in weight management and the importance of lifestyle modifications. Encouraged moderate physical activity and calorie tracking to aid in weight loss. - Increased tirzepatide  to 7.5 mg weekly - Encouraged moderate physical activity, aiming for 240 minutes per week - Advised tracking food intake to maintain a 1800 calorie per day goal      Relevant Medications   tirzepatide  (MOUNJARO ) 7.5 MG/0.5ML Pen   Gastroesophageal reflux disease without esophagitis    Chronic  Managed with omeprazole  40  mg daily. - Continue omeprazole  40 mg daily      Relevant Medications   ondansetron  (ZOFRAN -ODT) 4 MG disintegrating tablet   History of gestational hypertension   Noted  Now treated for chronic DM  Continue to monitor glucose levels       PCOS (polycystic ovarian syndrome)   Polycystic ovarian syndrome Chronic  Managed with metformin . Reports difficulty with weight loss and concerns about family history of ovarian and cervical cancer. Discussed the importance of regular gynecological screenings. - Continue metformin  - Encouraged regular gynecological screenings      Type 2 diabetes mellitus with hyperglycemia, without long-term current use of insulin  (HCC) - Primary   Type 2 diabetes mellitus with hyperglycemia and hyperlipidemia Chronic type 2 diabetes mellitus with hyperglycemia and hyperlipidemia. Current regimen includes metformin  and tirzepatide . Reports nausea and vomiting after taking metformin  close to tirzepatide  injection. Blood pressure is well controlled. A1c needs to be checked. Discussed increasing tirzepatide  to 7.5 mg weekly to aid in weight loss and glycemic control. Discussed potential side effects of increased tirzepatide  dose, including nausea and vomiting. Encouraged dietary changes and increased physical activity to aid in weight loss. - Ordered A1c test - Increased tirzepatide  to 7.5 mg weekly - Prescribed Zofran  4 mg ODT as needed for nausea - Ordered CMP to check liver enzymes - Ordered cholesterol panel      Relevant Medications   tirzepatide  (MOUNJARO ) 7.5 MG/0.5ML Pen   Other Relevant Orders   Hemoglobin A1c   Other Visit Diagnoses       Elevated TSH       Relevant Orders   TSH + free T4     Nausea and vomiting, unspecified vomiting type       Relevant Medications   ondansetron  (ZOFRAN -ODT) 4 MG disintegrating tablet     Steatosis (HCC)       Relevant Orders   CMP14+EGFR     Hyperlipidemia associated with type 2 diabetes mellitus (HCC)        Relevant Medications   tirzepatide  (MOUNJARO ) 7.5 MG/0.5ML Pen   Other Relevant Orders  Lipid panel       Assessment and Plan Assessment & Plan    Steatosis (fatty liver) Chronic  Steatosis with fluctuating liver enzyme levels. Previous diagnosis of fatty liver with fluctuating ALT levels. Discussed the importance of monitoring liver function. - Ordered CMP to check liver enzymes -recommended lifestyle management along with continuing mounjaro  now at 7.5mg  weekly   General health maintenance Discussed the importance of regular screenings and vaccinations. Recommended diabetes eye exam, Pap smear, HPV screening, and HPV vaccine. Discussed the importance of regular gynecological care due to family history of ovarian and cervical cancer. - Recommended diabetes eye exam - Recommended Pap smear for cervical cancer screening - Recommended HPV screening - Recommended HPV vaccine - Encouraged regular gynecological care    Return in about 3 months (around 07/30/2024) for DM.    Rockie Agent, MD [facility]   "

## 2024-05-01 NOTE — Assessment & Plan Note (Signed)
" °  Chronic  Managed with omeprazole  40 mg daily. - Continue omeprazole  40 mg daily "

## 2024-05-01 NOTE — Patient Instructions (Signed)
 To keep you healthy, please keep in mind the following health maintenance items that you are due for:   Health Maintenance Due  Topic Date Due   OPHTHALMOLOGY EXAM  Never done   Pneumococcal Vaccine (1 of 2 - PCV) Never done   Cervical Cancer Screening (Pap smear)  Never done   COVID-19 Vaccine (3 - Pfizer risk series) 01/05/2020   HPV VACCINES (1 - 3-dose SCDM series) Never done     Best Wishes,   Dr. Lang

## 2024-05-01 NOTE — Assessment & Plan Note (Signed)
 Class 3 obesity Chronic  BMI of 42. Reports difficulty losing weight despite dietary changes and increased physical activity. Discussed the role of tirzepatide  in weight management and the importance of lifestyle modifications. Encouraged moderate physical activity and calorie tracking to aid in weight loss. - Increased tirzepatide  to 7.5 mg weekly - Encouraged moderate physical activity, aiming for 240 minutes per week - Advised tracking food intake to maintain a 1800 calorie per day goal

## 2024-05-01 NOTE — Assessment & Plan Note (Signed)
 Type 2 diabetes mellitus with hyperglycemia and hyperlipidemia Chronic type 2 diabetes mellitus with hyperglycemia and hyperlipidemia. Current regimen includes metformin  and tirzepatide . Reports nausea and vomiting after taking metformin  close to tirzepatide  injection. Blood pressure is well controlled. A1c needs to be checked. Discussed increasing tirzepatide  to 7.5 mg weekly to aid in weight loss and glycemic control. Discussed potential side effects of increased tirzepatide  dose, including nausea and vomiting. Encouraged dietary changes and increased physical activity to aid in weight loss. - Ordered A1c test - Increased tirzepatide  to 7.5 mg weekly - Prescribed Zofran  4 mg ODT as needed for nausea - Ordered CMP to check liver enzymes - Ordered cholesterol panel

## 2024-05-01 NOTE — Assessment & Plan Note (Signed)
" °  Anxiety and depression Chronic,stable  Managed with Prozac  20 mg daily. Reports doing well overall. - Continue Prozac  20 mg daily "

## 2024-05-01 NOTE — Assessment & Plan Note (Signed)
 Polycystic ovarian syndrome Chronic  Managed with metformin . Reports difficulty with weight loss and concerns about family history of ovarian and cervical cancer. Discussed the importance of regular gynecological screenings. - Continue metformin  - Encouraged regular gynecological screenings

## 2024-05-01 NOTE — Assessment & Plan Note (Signed)
 Noted  Now treated for chronic DM  Continue to monitor glucose levels

## 2024-05-02 ENCOUNTER — Other Ambulatory Visit: Payer: Self-pay

## 2024-05-02 LAB — CMP14+EGFR
ALT: 23 IU/L (ref 0–32)
AST: 17 IU/L (ref 0–40)
Albumin: 4.4 g/dL (ref 4.0–5.0)
Alkaline Phosphatase: 96 IU/L (ref 41–116)
BUN/Creatinine Ratio: 17 (ref 9–23)
BUN: 10 mg/dL (ref 6–20)
Bilirubin Total: 0.4 mg/dL (ref 0.0–1.2)
CO2: 23 mmol/L (ref 20–29)
Calcium: 9.1 mg/dL (ref 8.7–10.2)
Chloride: 101 mmol/L (ref 96–106)
Creatinine, Ser: 0.6 mg/dL (ref 0.57–1.00)
Globulin, Total: 2.7 g/dL (ref 1.5–4.5)
Glucose: 88 mg/dL (ref 70–99)
Potassium: 4.5 mmol/L (ref 3.5–5.2)
Sodium: 138 mmol/L (ref 134–144)
Total Protein: 7.1 g/dL (ref 6.0–8.5)
eGFR: 125 mL/min/1.73

## 2024-05-02 LAB — HEMOGLOBIN A1C
Est. average glucose Bld gHb Est-mCnc: 157 mg/dL
Hgb A1c MFr Bld: 7.1 % — ABNORMAL HIGH (ref 4.8–5.6)

## 2024-05-02 LAB — LIPID PANEL
Chol/HDL Ratio: 3.9 ratio (ref 0.0–4.4)
Cholesterol, Total: 142 mg/dL (ref 100–199)
HDL: 36 mg/dL — ABNORMAL LOW
LDL Chol Calc (NIH): 82 mg/dL (ref 0–99)
Triglycerides: 137 mg/dL (ref 0–149)
VLDL Cholesterol Cal: 24 mg/dL (ref 5–40)

## 2024-05-02 LAB — TSH+FREE T4
Free T4: 1.07 ng/dL (ref 0.82–1.77)
TSH: 1.78 u[IU]/mL (ref 0.450–4.500)

## 2024-05-04 ENCOUNTER — Ambulatory Visit: Payer: Self-pay | Admitting: Family Medicine

## 2024-05-04 DIAGNOSIS — E1169 Type 2 diabetes mellitus with other specified complication: Secondary | ICD-10-CM | POA: Insufficient documentation

## 2024-05-05 ENCOUNTER — Other Ambulatory Visit: Payer: Self-pay

## 2024-05-05 ENCOUNTER — Encounter: Payer: Self-pay | Admitting: Pharmacist

## 2024-05-07 ENCOUNTER — Other Ambulatory Visit: Payer: Self-pay

## 2024-05-08 ENCOUNTER — Other Ambulatory Visit: Payer: Self-pay

## 2024-05-15 ENCOUNTER — Other Ambulatory Visit (HOSPITAL_COMMUNITY): Payer: Self-pay

## 2024-05-15 ENCOUNTER — Other Ambulatory Visit: Payer: Self-pay

## 2024-05-16 ENCOUNTER — Other Ambulatory Visit (HOSPITAL_COMMUNITY): Payer: Self-pay

## 2024-07-10 ENCOUNTER — Ambulatory Visit: Admitting: Family Medicine

## 2024-07-30 ENCOUNTER — Ambulatory Visit: Admitting: Family Medicine
